# Patient Record
Sex: Female | Born: 1950 | Hispanic: No | Marital: Single | Smoking: Former smoker
Health system: Southern US, Community
[De-identification: ages and names within clinical notes are randomized; demographics above are authoritative.]

## PROBLEM LIST (undated history)

## (undated) DIAGNOSIS — E049 Nontoxic goiter, unspecified: Secondary | ICD-10-CM

## (undated) DIAGNOSIS — E785 Hyperlipidemia, unspecified: Secondary | ICD-10-CM

## (undated) DIAGNOSIS — Z860100 Personal history of colon polyps, unspecified: Secondary | ICD-10-CM

## (undated) DIAGNOSIS — K5900 Constipation, unspecified: Secondary | ICD-10-CM

## (undated) DIAGNOSIS — K649 Unspecified hemorrhoids: Secondary | ICD-10-CM

## (undated) DIAGNOSIS — R42 Dizziness and giddiness: Secondary | ICD-10-CM

## (undated) DIAGNOSIS — T7840XA Allergy, unspecified, initial encounter: Secondary | ICD-10-CM

## (undated) HISTORY — DX: Dizziness and giddiness: R42

## (undated) HISTORY — DX: Nontoxic goiter, unspecified: E04.9

## (undated) HISTORY — DX: Personal history of colonic polyps: Z86.010

## (undated) HISTORY — DX: Unspecified hemorrhoids: K64.9

## (undated) HISTORY — PX: TUBAL LIGATION: SHX77

## (undated) HISTORY — DX: Allergy, unspecified, initial encounter: T78.40XA

## (undated) HISTORY — DX: Hyperlipidemia, unspecified: E78.5

## (undated) HISTORY — DX: Personal history of colon polyps, unspecified: Z86.0100

## (undated) HISTORY — PX: TONSILLECTOMY: SUR1361

## (undated) HISTORY — DX: Constipation, unspecified: K59.00

---

## 2008-08-12 ENCOUNTER — Emergency Department (HOSPITAL_COMMUNITY): Admission: EM | Admit: 2008-08-12 | Discharge: 2008-08-12 | Payer: Self-pay | Admitting: Emergency Medicine

## 2008-12-16 ENCOUNTER — Emergency Department (HOSPITAL_COMMUNITY): Admission: EM | Admit: 2008-12-16 | Discharge: 2008-12-16 | Payer: Self-pay | Admitting: Emergency Medicine

## 2009-01-21 ENCOUNTER — Emergency Department (HOSPITAL_COMMUNITY): Admission: EM | Admit: 2009-01-21 | Discharge: 2009-01-21 | Payer: Self-pay | Admitting: Emergency Medicine

## 2010-07-22 LAB — URINALYSIS, ROUTINE W REFLEX MICROSCOPIC
Glucose, UA: NEGATIVE mg/dL
pH: 6 (ref 5.0–8.0)

## 2010-07-22 LAB — POCT I-STAT, CHEM 8
Calcium, Ion: 1.18 mmol/L (ref 1.12–1.32)
Glucose, Bld: 75 mg/dL (ref 70–99)
HCT: 47 % — ABNORMAL HIGH (ref 36.0–46.0)
Hemoglobin: 16 g/dL — ABNORMAL HIGH (ref 12.0–15.0)
Potassium: 4 mEq/L (ref 3.5–5.1)
TCO2: 26 mmol/L (ref 0–100)

## 2010-07-22 LAB — URINE MICROSCOPIC-ADD ON

## 2013-03-17 DEATH — deceased

## 2015-10-26 ENCOUNTER — Encounter: Payer: Self-pay | Admitting: Gastroenterology

## 2015-12-14 ENCOUNTER — Telehealth: Payer: Self-pay | Admitting: Gastroenterology

## 2015-12-14 ENCOUNTER — Ambulatory Visit (AMBULATORY_SURGERY_CENTER): Payer: Self-pay

## 2015-12-14 VITALS — Ht 64.0 in | Wt 154.0 lb

## 2015-12-14 DIAGNOSIS — Z86010 Personal history of colonic polyps: Secondary | ICD-10-CM

## 2015-12-14 MED ORDER — NA SULFATE-K SULFATE-MG SULF 17.5-3.13-1.6 GM/177ML PO SOLN: ORAL | 0 refills | Status: DC

## 2015-12-14 NOTE — Telephone Encounter (Signed)
Called pt and left message that Suprep bowel prep was sent to [facility] on [address].Call our office if she has any further questions

## 2015-12-14 NOTE — Progress Notes (Signed)
Per pt, no allergies to soy or egg products.Pt not taking any weight loss meds or using  O2 at home.   Pt states she had a colonoscopy done in [place]  5 years ago due to history of polyps.Will try to get records.

## 2015-12-29 ENCOUNTER — Encounter: Payer: Self-pay | Admitting: Gastroenterology

## 2016-09-01 ENCOUNTER — Other Ambulatory Visit: Payer: Self-pay | Admitting: Otolaryngology

## 2016-09-01 DIAGNOSIS — H8110 Benign paroxysmal vertigo, unspecified ear: Secondary | ICD-10-CM

## 2016-09-04 ENCOUNTER — Ambulatory Visit
Admission: RE | Admit: 2016-09-04 | Discharge: 2016-09-04 | Disposition: A | Discharge status: Home / Self Care | Payer: Medicare (Managed Care) | Source: Ambulatory Visit | Attending: Otolaryngology | Admitting: Otolaryngology

## 2016-09-04 DIAGNOSIS — H8110 Benign paroxysmal vertigo, unspecified ear: Secondary | ICD-10-CM

## 2016-09-04 MED ORDER — GADOBENATE DIMEGLUMINE 529 MG/ML IV SOLN
14.0000 mL | Freq: Once | INTRAVENOUS | Status: AC | PRN
  Administered 2016-09-04: 14 mL via INTRAVENOUS

## 2016-09-12 ENCOUNTER — Encounter: Payer: Self-pay | Admitting: Neurology

## 2016-09-12 ENCOUNTER — Ambulatory Visit (INDEPENDENT_AMBULATORY_CARE_PROVIDER_SITE_OTHER): Payer: Medicare (Managed Care) | Admitting: Neurology

## 2016-09-12 DIAGNOSIS — R42 Dizziness and giddiness: Secondary | ICD-10-CM | POA: Diagnosis not present

## 2016-09-12 MED ORDER — TOPIRAMATE 25 MG PO TABS: ORAL_TABLET | ORAL | 3 refills | Status: AC

## 2016-09-12 NOTE — Patient Instructions (Signed)
   We will give a trial on Topamax to see if this helps the dizziness.  Topamax (topiramate) is a seizure medication that has an FDA approval for seizures and for migraine headache. Potential side effects of this medication include weight loss, cognitive slowing, tingling in the fingers and toes, and carbonated drinks will taste bad. If any significant side effects are noted on this drug, please contact our office.

## 2016-09-12 NOTE — Progress Notes (Signed)
Reason for visit: Dizziness  Referring physician: Dr. Blase Mess [name] is a 66 y.o. female  History of present illness:  Hailey [name] is a 66 year old right-handed white female with a history of dizziness that dates back 5 years. The patient indicates that the symptoms have become more severe over time. The patient indicates that the symptoms began 5 years ago abruptly, they came on in the morning and was associated with true vertigo, she had nausea associated with this. The patient has had ongoing episodes, she has dizziness on a daily basis associated with head movement. The patient may have episodes where the symptoms become much more severe lasting 2 or 3 days and she is not able to function during these episodes. The patient reports some sensation of water in the right ear, no definite loss of hearing, she may have a humming noise in the right ear. The patient does not report any loss of hearing. She claims that she feels dizzy all the time to some degree. She denies any significant problems with headaches, she does have episodes of sparkling light and zig-zag lines in the vision that may come and go, the last episode occurred one month ago. The patient reports some chronic issues within the last several months with numbness involving the left face, arm, and leg. She denies any issues with falling, she does have constipation problems, she denies any issues with controlling the bladder. She was seen by Dr. Polly Cobia and a MRI of the brain was done. This shows fairly extensive involvement with white matter changes in the pons area. Otherwise, no significant small vessel disease is noted. The patient does not have a history of hypertension. She is sent to this office for an evaluation.   Past Medical History:  Diagnosis Date  . Allergy    seasonal  . Constipated    hx of/Miralax helps prn  . Goiter   . Hemorrhoids    history of   . History of colon polyps   . Vertigo    on and off     Past Surgical History:  Procedure Laterality Date  . TONSILLECTOMY     1960  . TUBAL LIGATION     1981    Family History  Problem Relation Age of Onset  . Diabetes Mother   . Hypertension Mother   . Heart disease Mother   . Gout Mother   . Alzheimer's disease Father   . Ulcers Brother     Social history:  reports that she quit smoking about 9 months ago. She smoked 1.00 pack per day. She has never used smokeless tobacco. She reports that she does not drink alcohol or use drugs.  Medications:  Prior to Admission medications   Medication Sig Start Date End Date Taking? Authorizing Provider  Cholecalciferol (VITAMIN D-3) 1000 units CAPS Take by mouth daily.   Yes [provider]  meclizine (ANTIVERT) 12.5 MG tablet Take 12.5 mg by mouth as needed for dizziness.   Yes [provider]  Multiple Vitamin (MULTIVITAMIN) tablet Take 1 tablet by mouth daily.   Yes [provider]  polyethylene glycol (MIRALAX / GLYCOLAX) packet Take 17 g by mouth daily as needed.   Yes [provider]  VIT C-CHOLECALCIFEROL-ROSE HIP PO Take 1 Dose by mouth daily.   Yes [provider]  vitamin B-12 (CYANOCOBALAMIN) 1000 MCG tablet Take 1,000 mcg by mouth daily.   Yes [provider]     No Known Allergies  ROS:  Out of a complete 14 system review of symptoms, the patient complains only of the following symptoms, and all other reviewed systems are negative.  Chills Palpitations of the heart Ringing in the ears Itching Feeling hot Achy muscles Headache, numbness, dizziness Not enough sleep Insomnia, restless legs  Blood pressure 102/64, pulse 60, height 5\' 4"  (1.626 m), weight 157 lb 8 oz (71.4 kg).  Physical Exam  General: The patient is alert and cooperative at the time of the examination.  Eyes: Pupils are equal, round, and reactive to light. Discs are flat bilaterally.  Neck: The neck is supple, no carotid bruits are  noted.  Respiratory: The respiratory examination is clear.  Cardiovascular: The cardiovascular examination reveals a regular rate and rhythm, no obvious murmurs or rubs are noted.  Neuromuscular: Range of movement of the cervical spine is full.  Skin: Extremities are without significant edema.  Neurologic Exam  Mental status: The patient is alert and oriented x 3 at the time of the examination. The patient has apparent normal recent and remote memory, with an apparently normal attention span and concentration ability.  Cranial nerves: Facial symmetry is present. There is good sensation of the face to pinprick and soft touch on the right, decreased on the left face. The strength of the facial muscles and the muscles to head turning and shoulder shrug are normal bilaterally. Speech is well enunciated, no aphasia or dysarthria is noted. Extraocular movements are full. Visual fields are full. The tongue is midline, and the patient has symmetric elevation of the soft palate. No obvious hearing deficits are noted.  Motor: The motor testing reveals 5 over 5 strength of all 4 extremities. Good symmetric motor tone is noted throughout.  Sensory: Sensory testing is intact to pinprick, soft touch, vibration sensation, and position sense on all 4 extremities, with exception of some decreased in pinprick sensation on the left arm and left leg. No evidence of extinction is noted.  Coordination: Cerebellar testing reveals good finger-nose-finger and heel-to-shin bilaterally.  Gait and station: Gait is normal. Tandem gait is unsteady. Romberg is negative. No drift is seen. The Nyan-Barrany procedure does elicit some subjective sensation of dizziness, but no nystagmus is seen.  Reflexes: Deep tendon reflexes are symmetric and normal bilaterally. Toes are downgoing bilaterally.   MRI brain 09/04/16:  IMPRESSION: Prominent pontine T2 and FLAIR hyperintensities favored to represent chronic microvascular  ischemic change. These changes are less prominent in the supratentorial region.  No retrocochlear lesion is identified. No temporal bone inflammatory process is evident.  * MRI scan images were reviewed online. I agree with the written report.    Assessment/Plan:  1. Chronic dizziness, vertigo  2. Abnormal MRI of the brain  3. Migraine or migraine equivalent  The patient appears to have what looks like ischemic changes in the pons area that could lead to chronic dizziness. The patient does not have a significant history of hypertension, however. She will be going on low-dose aspirin. The patient does have a history of migraine or migraine equivalent, I am not clear that this is the etiology of her dizziness. We will give her a trial on low-dose Topamax, if this is not effective, we may consider the use of diazepam in the future. If the changes of the pons seen on MRI are the source of the dizziness, this may be a difficult issue to treat. The patient will follow-up in 4 months.  Hailey Alexanders MD 09/12/2016 9:44 AM  [facility]  [address] [place], [place] [address]  Phone [phone] Fax [phone]

## 2017-02-15 ENCOUNTER — Emergency Department (HOSPITAL_BASED_OUTPATIENT_CLINIC_OR_DEPARTMENT_OTHER)
Admission: EM | Admit: 2017-02-15 | Discharge: 2017-02-15 | Disposition: A | Discharge status: Home / Self Care | Payer: Medicare (Managed Care) | Attending: Emergency Medicine | Admitting: Emergency Medicine

## 2017-02-15 ENCOUNTER — Emergency Department (HOSPITAL_BASED_OUTPATIENT_CLINIC_OR_DEPARTMENT_OTHER): Payer: Medicare (Managed Care)

## 2017-02-15 ENCOUNTER — Encounter (HOSPITAL_BASED_OUTPATIENT_CLINIC_OR_DEPARTMENT_OTHER): Payer: Self-pay | Admitting: *Deleted

## 2017-02-15 DIAGNOSIS — R110 Nausea: Secondary | ICD-10-CM | POA: Insufficient documentation

## 2017-02-15 DIAGNOSIS — Z87891 Personal history of nicotine dependence: Secondary | ICD-10-CM | POA: Insufficient documentation

## 2017-02-15 DIAGNOSIS — R51 Headache: Secondary | ICD-10-CM | POA: Diagnosis not present

## 2017-02-15 DIAGNOSIS — R200 Anesthesia of skin: Secondary | ICD-10-CM | POA: Insufficient documentation

## 2017-02-15 DIAGNOSIS — R519 Headache, unspecified: Secondary | ICD-10-CM

## 2017-02-15 LAB — BASIC METABOLIC PANEL
Anion gap: 4 — ABNORMAL LOW (ref 5–15)
BUN: 14 mg/dL (ref 6–20)
CALCIUM: 9.5 mg/dL (ref 8.9–10.3)
CHLORIDE: 103 mmol/L (ref 101–111)
CO2: 29 mmol/L (ref 22–32)
CREATININE: 0.77 mg/dL (ref 0.44–1.00)
GFR calc Af Amer: >60 mL/min (ref >60)
GFR calc non Af Amer: >60 mL/min (ref >60)
GLUCOSE: 86 mg/dL (ref 65–99)
Potassium: 4.6 mmol/L (ref 3.5–5.1)
Sodium: 136 mmol/L (ref 135–145)

## 2017-02-15 LAB — CBC WITH DIFFERENTIAL/PLATELET
BASOS PCT: 1 %
Basophils Absolute: 0 10*3/uL (ref 0.0–0.1)
Eosinophils Absolute: 0.5 10*3/uL (ref 0.0–0.7)
Eosinophils Relative: 11 %
HEMATOCRIT: 43.7 % (ref 36.0–46.0)
Hemoglobin: 14.6 g/dL (ref 12.0–15.0)
LYMPHS ABS: 1.7 10*3/uL (ref 0.7–4.0)
Lymphocytes Relative: 39 %
MCH: 28.6 pg (ref 26.0–34.0)
MCHC: 33.4 g/dL (ref 30.0–36.0)
MCV: 85.7 fL (ref 78.0–100.0)
MONO ABS: 0.4 10*3/uL (ref 0.1–1.0)
MONOS PCT: 10 %
NEUTROS ABS: 1.7 10*3/uL (ref 1.7–7.7)
Neutrophils Relative %: 39 %
Platelets: 271 10*3/uL (ref 150–400)
RBC: 5.1 MIL/uL (ref 3.87–5.11)
RDW: 13.1 % (ref 11.5–15.5)
WBC: 4.3 10*3/uL (ref 4.0–10.5)

## 2017-02-15 MED ORDER — SODIUM CHLORIDE 0.9 % IV BOLUS (SEPSIS)
500.0000 mL | Freq: Once | INTRAVENOUS | Status: AC
  Administered 2017-02-15: 500 mL via INTRAVENOUS

## 2017-02-15 MED ORDER — METOCLOPRAMIDE HCL 5 MG/ML IJ SOLN
10.0000 mg | Freq: Once | INTRAMUSCULAR | Status: AC
  Administered 2017-02-15: 10 mg via INTRAVENOUS
  Filled 2017-02-15: qty 2

## 2017-02-15 MED ORDER — KETOROLAC TROMETHAMINE 30 MG/ML IJ SOLN
30.0000 mg | Freq: Once | INTRAMUSCULAR | Status: AC
  Administered 2017-02-15: 30 mg via INTRAVENOUS
  Filled 2017-02-15: qty 1

## 2017-02-15 NOTE — ED Provider Notes (Signed)
Emergency Department Provider Note   I have reviewed the triage vital signs and the nursing notes.   HISTORY  Chief Complaint Headache and Nausea   HPI Hailey [name] is a 66 y.o. female resents to the emergency department for evaluation of past 3 days.  She is also had intermittent numb feeling in the left leg and her tips.  She states she has had headaches before but this 1 feels very different.  She has nausea but no vomiting.  No head injury.  No fevers or chills.  No history of similar headaches in the past.  The patient's left leg numbness is described as during the night.  The patient states she will wake up and have pain and numbness in the leg.  She will reposition which point she has a pins and needle sensation and the numbness resolves.  She denies any current numbness or tingling in the arms or legs.  No speech changes.  No vision changes. No new medications.    Past Medical History:  Diagnosis Date  . Allergy    seasonal  . Constipated    hx of/Miralax helps prn  . Goiter   . Hemorrhoids    history of   . History of colon polyps   . Vertigo    on and off    Patient Active Problem List   Diagnosis Date Noted  . Vertigo 09/12/2016    Past Surgical History:  Procedure Laterality Date  . TONSILLECTOMY     1960  . TUBAL LIGATION     1981    Current Outpatient Rx  . Order #: [id number] Class: Historical Med  . Order #: [id number] Class: Historical Med  . Order #: [id number] Class: Historical Med  . Order #: [id number] Class: Historical Med  . Order #: [id number] Class: Normal  . Order #: [id number] Class: Historical Med  . Order #: [id number] Class: Historical Med    Allergies Patient has no known allergies.  Family History  Problem Relation Age of Onset  . Diabetes Mother   . Hypertension Mother   . Heart disease Mother   . Gout Mother   . Alzheimer's disease Father   . Ulcers Brother     Social History Social History  Substance Use Topics  . Smoking  status: Former Smoker    Packs/day: 1.00    Quit date: 11/27/2015  . Smokeless tobacco: Never Used  . Alcohol use No    Review of Systems  Constitutional: No fever/chills Eyes: No visual changes. ENT: No sore throat. Cardiovascular: Denies chest pain. Respiratory: Denies shortness of breath. Gastrointestinal: No abdominal pain. Positive nausea, no vomiting.  No diarrhea.  No constipation. Genitourinary: Negative for dysuria. Musculoskeletal: Negative for back pain. Skin: Negative for rash. Neurological: Negative for focal weakness or numbness. Positive HA.   10-point ROS otherwise negative.  ____________________________________________   PHYSICAL EXAM:  VITAL SIGNS: ED Triage Vitals  Enc Vitals Group     BP 02/15/17 1253 118/77     Pulse Rate 02/15/17 1253 67     Resp 02/15/17 1253 18     Temp 02/15/17 1253 98 F (36.7 C)     Temp Source 02/15/17 1253 Oral     SpO2 02/15/17 1253 98 %     Weight 02/15/17 1251 157 lb (71.2 kg)     Height 02/15/17 1251 5\' 4"  (1.626 m)     Pain Score 02/15/17 1250 8   Constitutional: Alert and oriented. Well appearing and in no acute distress. Eyes: Conjunctivae are normal.  PERRL. EOMI.  Head: Atraumatic. Nose: No congestion/rhinnorhea. Mouth/Throat: Mucous membranes are moist.  Neck: No stridor.  Cardiovascular: Normal rate, regular rhythm. Good peripheral circulation. Grossly normal heart sounds.   Respiratory: Normal respiratory effort.  No retractions. Lungs CTAB. Gastrointestinal: Soft and nontender. No distention.  Musculoskeletal: No lower extremity tenderness nor edema. No gross deformities of extremities. Neurologic:  Normal speech and language. No gross focal neurologic deficits are appreciated. Normal finger-to-nose testing, Normal CN exam 2-12. No pronator drift.  Skin:  Skin is warm, dry and intact. No rash noted.  ____________________________________________   LABS (all labs ordered are listed, but only abnormal  results are displayed)  Labs Reviewed  BASIC METABOLIC PANEL - Abnormal; Notable for the following:       Result Value   Anion gap 4 (*)    All other components within normal limits  CBC WITH DIFFERENTIAL/PLATELET   ____________________________________________  RADIOLOGY  Ct Head Wo Contrast  Result Date: 02/15/2017 CLINICAL DATA:  Headache, nausea EXAM: CT HEAD WITHOUT CONTRAST TECHNIQUE: Contiguous axial images were obtained from the base of the skull through the vertex without intravenous contrast. COMPARISON:  09/04/2016 MRI FINDINGS: Brain: No acute intracranial abnormality. Specifically, no hemorrhage, hydrocephalus, mass lesion, acute infarction, or significant intracranial injury. Vascular: No hyperdense vessel or unexpected calcification. Skull: No acute calvarial abnormality. Sinuses/Orbits: Visualized paranasal sinuses and mastoids clear. Orbital soft tissues unremarkable. Other: None IMPRESSION: No acute intracranial abnormality. Electronically Signed   By: Rolm Baptise M.D.   On: 02/15/2017 13:14    ____________________________________________   PROCEDURES  Procedure(s) performed:   Procedures  None ____________________________________________   INITIAL IMPRESSION / ASSESSMENT AND PLAN / ED COURSE  Pertinent labs & imaging results that were available during my care of the patient were reviewed by me and considered in my medical decision making (see chart for details).  Patient presents to the emergency department for evaluation of headache with nausea.  Her numbness appears to be only occurring at night and improved with change in position.  No residual numbness on my exam.  No weakness.  No sudden onset, maximal intensity headache symptoms.  Given that this is new headache that is been going on for 3 days, along with the patient's age, I do plan for screening head CT and baseline labs.  Will treat with Toradol and Reglan.   HA completely resolved with Toradol and  Reglan. CT negative. Labs negative. Plan for PCP follow up and supportive care at home. Will keep HA diary and f/u with Neurology if HA returns/persists. Discussed return precautions in detail.   At this time, I do not feel there is any life-threatening condition present. I have reviewed and discussed all results (EKG, imaging, lab, urine as appropriate), exam findings with patient. I have reviewed nursing notes and appropriate previous records.  I feel the patient is safe to be discharged home without further emergent workup. Discussed usual and customary return precautions. Patient and family (if present) verbalize understanding and are comfortable with this plan.  Patient will follow-up with their primary care provider. If they do not have a primary care provider, information for follow-up has been provided to them. All questions have been answered.  ____________________________________________  FINAL CLINICAL IMPRESSION(S) / ED DIAGNOSES  Final diagnoses:  Bad headache     MEDICATIONS GIVEN DURING THIS VISIT:  Medications  sodium chloride 0.9 % bolus 500 mL (0 mLs Intravenous Stopped 02/15/17 1417)  ketorolac (TORADOL) 30 MG/ML injection 30 mg (30 mg Intravenous Given 02/15/17  1324)  metoCLOPramide (REGLAN) injection 10 mg (10 mg Intravenous Given 02/15/17 1325)     NEW OUTPATIENT MEDICATIONS STARTED DURING THIS VISIT:  None   Note:  This document was prepared using Dragon voice recognition software and may include unintentional dictation errors.  Nanda Quinton, MD Emergency Medicine    Carey Johndrow, [facility], MD 02/15/17 [phone]

## 2017-02-15 NOTE — Discharge Instructions (Signed)

## 2017-02-15 NOTE — ED Triage Notes (Addendum)
Headache and nausea x 3 days. No hx of headaches. States the only other thing that has been different is a numb feeling in her left leg x 3 days. She is ambulatory.

## 2017-05-10 ENCOUNTER — Encounter: Payer: Self-pay | Admitting: Family Medicine

## 2018-02-15 ENCOUNTER — Emergency Department (HOSPITAL_COMMUNITY): Payer: Medicare (Managed Care)

## 2018-02-15 ENCOUNTER — Encounter (HOSPITAL_COMMUNITY): Payer: Self-pay | Admitting: Nurse Practitioner

## 2018-02-15 ENCOUNTER — Other Ambulatory Visit: Payer: Self-pay

## 2018-02-15 ENCOUNTER — Observation Stay (HOSPITAL_COMMUNITY)
Admission: EM | Admit: 2018-02-15 | Discharge: 2018-02-17 | Disposition: A | Discharge status: Home / Self Care | Payer: Medicare (Managed Care) | Attending: Surgery | Admitting: Surgery

## 2018-02-15 DIAGNOSIS — Z87891 Personal history of nicotine dependence: Secondary | ICD-10-CM | POA: Diagnosis not present

## 2018-02-15 DIAGNOSIS — M5137 Other intervertebral disc degeneration, lumbosacral region: Secondary | ICD-10-CM | POA: Insufficient documentation

## 2018-02-15 DIAGNOSIS — Z7982 Long term (current) use of aspirin: Secondary | ICD-10-CM | POA: Insufficient documentation

## 2018-02-15 DIAGNOSIS — I708 Atherosclerosis of other arteries: Secondary | ICD-10-CM | POA: Insufficient documentation

## 2018-02-15 DIAGNOSIS — Z79899 Other long term (current) drug therapy: Secondary | ICD-10-CM | POA: Insufficient documentation

## 2018-02-15 DIAGNOSIS — K3580 Unspecified acute appendicitis: Secondary | ICD-10-CM | POA: Diagnosis not present

## 2018-02-15 DIAGNOSIS — K37 Unspecified appendicitis: Secondary | ICD-10-CM | POA: Diagnosis present

## 2018-02-15 LAB — BASIC METABOLIC PANEL
Anion gap: 8 (ref 5–15)
BUN: 12 mg/dL (ref 8–23)
CO2: 28 mmol/L (ref 22–32)
Calcium: 9.1 mg/dL (ref 8.9–10.3)
Chloride: 104 mmol/L (ref 98–111)
Creatinine, Ser: 0.74 mg/dL (ref 0.44–1.00)
Glucose, Bld: 102 mg/dL — ABNORMAL HIGH (ref 70–99)
POTASSIUM: 3.6 mmol/L (ref 3.5–5.1)
SODIUM: 140 mmol/L (ref 135–145)

## 2018-02-15 LAB — URINALYSIS, ROUTINE W REFLEX MICROSCOPIC
BACTERIA UA: NONE SEEN
Bilirubin Urine: NEGATIVE
GLUCOSE, UA: NEGATIVE mg/dL
KETONES UR: NEGATIVE mg/dL
Nitrite: NEGATIVE
PROTEIN: NEGATIVE mg/dL
Specific Gravity, Urine: 1.01 (ref 1.005–1.030)
pH: 6 (ref 5.0–8.0)

## 2018-02-15 LAB — CBC
HCT: 43.1 % (ref 36.0–46.0)
Hemoglobin: 13.7 g/dL (ref 12.0–15.0)
MCH: 27.9 pg (ref 26.0–34.0)
MCHC: 31.8 g/dL (ref 30.0–36.0)
MCV: 87.8 fL (ref 80.0–100.0)
Platelets: 285 10*3/uL (ref 150–400)
RBC: 4.91 MIL/uL (ref 3.87–5.11)
RDW: 11.9 % (ref 11.5–15.5)
WBC: 5.4 10*3/uL (ref 4.0–10.5)
nRBC: 0 % (ref 0.0–0.2)

## 2018-02-15 LAB — HEPATIC FUNCTION PANEL
ALT: 11 U/L (ref 0–44)
AST: 16 U/L (ref 15–41)
Albumin: 3.9 g/dL (ref 3.5–5.0)
Alkaline Phosphatase: 51 U/L (ref 38–126)
Total Bilirubin: 0.4 mg/dL (ref 0.3–1.2)
Total Protein: 7.3 g/dL (ref 6.5–8.1)

## 2018-02-15 LAB — LIPASE, BLOOD: LIPASE: 24 U/L (ref 11–51)

## 2018-02-15 MED ORDER — MORPHINE SULFATE (PF) 4 MG/ML IV SOLN: 4.0000 mg | INTRAVENOUS | Status: DC | PRN

## 2018-02-15 MED ORDER — ONDANSETRON HCL 4 MG/2ML IJ SOLN
4.0000 mg | Freq: Once | INTRAMUSCULAR | Status: AC
  Administered 2018-02-15: 4 mg via INTRAVENOUS
  Filled 2018-02-15: qty 2

## 2018-02-15 MED ORDER — SODIUM CHLORIDE 0.9 % IV BOLUS
500.0000 mL | Freq: Once | INTRAVENOUS | Status: AC
  Administered 2018-02-15: 500 mL via INTRAVENOUS

## 2018-02-15 MED ORDER — KETOROLAC TROMETHAMINE 30 MG/ML IJ SOLN
30.0000 mg | Freq: Once | INTRAMUSCULAR | Status: AC
  Administered 2018-02-15: 30 mg via INTRAVENOUS
  Filled 2018-02-15: qty 1

## 2018-02-15 MED ORDER — PIPERACILLIN-TAZOBACTAM 3.375 G IVPB 30 MIN
3.3750 g | Freq: Once | INTRAVENOUS | Status: AC
  Administered 2018-02-15: 3.375 g via INTRAVENOUS
  Filled 2018-02-15: qty 50

## 2018-02-15 NOTE — ED Triage Notes (Signed)
Pt is c/o right sided flank sharp pain that radiates to the lower back/costvertibral angel and anteriorly to her suprapubic/bladder area. Denies fever or chills.

## 2018-02-15 NOTE — ED Provider Notes (Signed)
Emergency Department Provider Note   I have reviewed the triage vital signs and the nursing notes.   HISTORY  Chief Complaint Flank Pain   HPI Hailey [name] is a 67 y.o. female with PMH of vertigo and hemorrhoids presents to the ED with sharp RLQ abdominal pain and right flank pain. Pain symptoms began 2 days prior and have worsened over that time.  The patient denies any dysuria, hesitancy, urgency.  She is not experienced fevers or chills.  She has reported some episodic lightheadedness.  Denies any chest pain, palpitations, shortness of breath.  No similar pain in the past.  No history of kidney stones.  No prior abdominal surgeries. No diarrhea. Pain is sharp, severe, and radiates to the right flank.    Past Medical History:  Diagnosis Date  . Allergy    seasonal  . Constipated    hx of/Miralax helps prn  . Goiter   . Hemorrhoids    history of   . History of colon polyps   . Vertigo    on and off    Patient Active Problem List   Diagnosis Date Noted  . Appendicitis 02/15/2018  . Vertigo 09/12/2016    Past Surgical History:  Procedure Laterality Date  . TONSILLECTOMY     1960  . TUBAL LIGATION     1981    Allergies Patient has no known allergies.  Family History  Problem Relation Age of Onset  . Diabetes Mother   . Hypertension Mother   . Heart disease Mother   . Gout Mother   . Alzheimer's disease Father   . Ulcers Brother     Social History Social History   Tobacco Use  . Smoking status: Former Smoker    Packs/day: 1.00    Last attempt to quit: 11/27/2015    Years since quitting: 2.2  . Smokeless tobacco: Never Used  Substance Use Topics  . Alcohol use: No  . Drug use: No    Review of Systems  Constitutional: No fever/chills. Positive intermittent lightheadedness.  Eyes: No visual changes. ENT: No sore throat. Cardiovascular: Denies chest pain. Respiratory: Denies shortness of breath. Gastrointestinal: Positive RLQ abdominal/flank  pain. Positive nausea, no vomiting. No diarrhea. No constipation. Genitourinary: Negative for dysuria. Musculoskeletal: Negative for back pain. Skin: Negative for rash. Neurological: Negative for headaches, focal weakness or numbness.  10-point ROS otherwise negative.  ____________________________________________   PHYSICAL EXAM:  VITAL SIGNS: ED Triage Vitals  Enc Vitals Group     BP 02/15/18 2136 121/69     Pulse Rate 02/15/18 2136 64     Resp 02/15/18 2136 16     Temp 02/15/18 2136 98 F (36.7 C)     Temp Source 02/15/18 2136 Oral     SpO2 02/15/18 2136 100 %     Weight 02/15/18 2136 146 lb 9.6 oz (66.5 kg)     Pain Score 02/15/18 2147 7   Constitutional: Alert and oriented. Well appearing and in no acute distress. Eyes: Conjunctivae are normal.  Head: Atraumatic. Nose: No congestion/rhinnorhea. Mouth/Throat: Mucous membranes are moist.  Oropharynx non-erythematous. Neck: No stridor.  Cardiovascular: Normal rate, regular rhythm. Good peripheral circulation. Grossly normal heart sounds.   Respiratory: Normal respiratory effort.  No retractions. Lungs CTAB. Gastrointestinal: Soft with tenderness to palpation in the RLQ with mild right CVA tenderness. No distention.  Musculoskeletal: No lower extremity tenderness nor edema. No gross deformities of extremities. Neurologic:  Normal speech and language. No gross focal neurologic deficits are  appreciated.  Skin:  Skin is warm, dry and intact. No rash noted.  ____________________________________________   LABS (all labs ordered are listed, but only abnormal results are displayed)  Labs Reviewed  URINALYSIS, ROUTINE W REFLEX MICROSCOPIC - Abnormal; Notable for the following components:      Result Value   Hgb urine dipstick SMALL (*)    Leukocytes, UA MODERATE (*)    All other components within normal limits  BASIC METABOLIC PANEL - Abnormal; Notable for the following components:   Glucose, Bld 102 (*)    All other  components within normal limits  URINE CULTURE  CBC  LIPASE, BLOOD  HEPATIC FUNCTION PANEL   ____________________________________________  RADIOLOGY  Ct Renal Stone Study  Result Date: 02/15/2018 CLINICAL DATA:  Right flank pain radiating to the pelvis since this morning. EXAM: CT ABDOMEN AND PELVIS WITHOUT CONTRAST TECHNIQUE: Multidetector CT imaging of the abdomen and pelvis was performed following the standard protocol without IV contrast. COMPARISON:  None. FINDINGS: Lower chest: Trace pericardial effusion. Top-normal size heart. Dependent atelectasis at each lung base. Hepatobiliary: Small cystic foci are noted of the left hepatic lobe, the largest slightly lobular in appearance and measuring up to 10 mm. No biliary dilatation. Contracted gallbladder without stones. Pancreas: Normal Spleen: Normal Adrenals/Urinary Tract: Normal bilateral adrenal glands. No nephrolithiasis. 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 The urinary bladder is unremarkable for the degree of distention. Stomach/Bowel: Appendix: Location: Right hemipelvis just lateral and anterior to the right psoas, series 2/55. Diameter: 9 mm Appendicolith: Near the base measuring up to 6 mm. Mucosal hyper-enhancement: Not applicable due to lack of IV contrast. Extraluminal gas: None Periappendiceal collection: No collection but periappendiceal inflammation is seen. No bowel obstruction. The stomach is unremarkable. Normal small bowel rotation is demonstrated. Average amount of fecal retention within the colon. Vascular/Lymphatic: Mild aortoiliac atherosclerosis.  No adenopathy. Reproductive: Uterus and bilateral adnexa are unremarkable. Other: No free air or free fluid. Musculoskeletal: Degenerative disc disease L5-S1. No acute nor aggressive osseous abnormalities. IMPRESSION: Acute uncomplicated appendicitis. 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 Hepatic hypodensities compatible  small cysts, largest approximately 1 cm. Electronically Signed   By: Ashley Royalty M.D.   On: 02/15/2018 22:55     EKG:    EKG Interpretation  Date/Time:  Friday February 15 2018 22:25:29 EDT Ventricular Rate:  57 PR Interval:    QRS Duration: 83 QT Interval:  419 QTC Calculation: 408 R Axis:   38 Text Interpretation:  Sinus rhythm No STEMI.  Confirmed by Nanda Quinton [id number]) on 02/15/2018 10:30:06 PM       ____________________________________________   PROCEDURES  Procedure(s) performed:   Procedures  None ____________________________________________   INITIAL IMPRESSION / ASSESSMENT AND PLAN / ED COURSE  Pertinent labs & imaging results that were available during my care of the patient were reviewed by me and considered in my medical decision making (see chart for details).  Patient presents to the emergency department for evaluation of right lower abdominal pain/right flank pain with nausea and some occasional lightheadedness.  EKG reviewed with no acute findings.  She does have tenderness on exam but clinically seems most consistent with a kidney stone.  She does have a small amount of blood on UA without evidence of infection.  11:21 PM CT reviewed with evidence of acute appendicitis. Patient last PO was 5:30 PM today. No leukocytosis. Will page surgery to discuss. Starting Zosyn.   Discussed patient's case with Surgery, Dr. Georgette Dover to request  admission. Patient and family (if present) updated with plan. Care transferred to Surgery service. I have placed holding orders at his request.   I reviewed all nursing notes, vitals, pertinent old records, EKGs, labs, imaging (as available).  ____________________________________________  FINAL CLINICAL IMPRESSION(S) / ED DIAGNOSES  Final diagnoses:  Acute appendicitis, unspecified acute appendicitis type     MEDICATIONS GIVEN DURING THIS VISIT:  Medications  sodium chloride 0.9 % bolus 500 mL (500 mLs Intravenous New  Bag/Given 02/15/18 2221)  piperacillin-tazobactam (ZOSYN) IVPB 3.375 g (has no administration in time range)  morphine 4 MG/ML injection 4 mg (has no administration in time range)  ketorolac (TORADOL) 30 MG/ML injection 30 mg (30 mg Intravenous Given 02/15/18 2223)  ondansetron (ZOFRAN) injection 4 mg (4 mg Intravenous Given 02/15/18 2222)    Note:  This document was prepared using Dragon voice recognition software and may include unintentional dictation errors.  Nanda Quinton, MD Emergency Medicine    Shakya Sebring, [facility], MD 02/15/18 [phone]

## 2018-02-16 ENCOUNTER — Encounter (HOSPITAL_COMMUNITY): Payer: Self-pay | Admitting: Registered Nurse

## 2018-02-16 ENCOUNTER — Observation Stay (HOSPITAL_COMMUNITY): Payer: Medicare (Managed Care) | Admitting: Anesthesiology

## 2018-02-16 ENCOUNTER — Other Ambulatory Visit: Payer: Self-pay

## 2018-02-16 ENCOUNTER — Encounter (HOSPITAL_COMMUNITY)
Admission: EM | Disposition: A | Discharge status: Home / Self Care | Payer: Self-pay | Source: Home / Self Care | Attending: Emergency Medicine

## 2018-02-16 DIAGNOSIS — I708 Atherosclerosis of other arteries: Secondary | ICD-10-CM | POA: Diagnosis not present

## 2018-02-16 DIAGNOSIS — K3580 Unspecified acute appendicitis: Secondary | ICD-10-CM | POA: Diagnosis not present

## 2018-02-16 DIAGNOSIS — Z7982 Long term (current) use of aspirin: Secondary | ICD-10-CM | POA: Diagnosis not present

## 2018-02-16 DIAGNOSIS — M5137 Other intervertebral disc degeneration, lumbosacral region: Secondary | ICD-10-CM | POA: Diagnosis not present

## 2018-02-16 HISTORY — PX: LAPAROSCOPIC APPENDECTOMY: SHX408

## 2018-02-16 LAB — SURGICAL PCR SCREEN
MRSA, PCR: NEGATIVE
STAPHYLOCOCCUS AUREUS: NEGATIVE

## 2018-02-16 SURGERY — APPENDECTOMY, LAPAROSCOPIC
Anesthesia: General | Site: Abdomen

## 2018-02-16 MED ORDER — OXYCODONE HCL 5 MG PO TABS: 5.0000 mg | ORAL_TABLET | Freq: Once | ORAL | Status: DC | PRN

## 2018-02-16 MED ORDER — 0.9 % SODIUM CHLORIDE (POUR BTL) OPTIME
TOPICAL | Status: DC | PRN
  Administered 2018-02-16: 1000 mL

## 2018-02-16 MED ORDER — ONDANSETRON HCL 4 MG/2ML IJ SOLN
INTRAMUSCULAR | Status: DC | PRN
  Administered 2018-02-16: 4 mg via INTRAVENOUS

## 2018-02-16 MED ORDER — OXYCODONE HCL 5 MG PO TABS
5.0000 mg | ORAL_TABLET | ORAL | Status: DC | PRN
  Administered 2018-02-16: 5 mg via ORAL
  Filled 2018-02-16: qty 1

## 2018-02-16 MED ORDER — FENTANYL CITRATE (PF) 250 MCG/5ML IJ SOLN
INTRAMUSCULAR | Status: AC
  Filled 2018-02-16: qty 5

## 2018-02-16 MED ORDER — ROCURONIUM BROMIDE 10 MG/ML (PF) SYRINGE
PREFILLED_SYRINGE | INTRAVENOUS | Status: DC | PRN
  Administered 2018-02-16: 25 mg via INTRAVENOUS
  Administered 2018-02-16: 5 mg via INTRAVENOUS

## 2018-02-16 MED ORDER — BUPIVACAINE HCL (PF) 0.5 % IJ SOLN
INTRAMUSCULAR | Status: DC | PRN
  Administered 2018-02-16: 20 mL

## 2018-02-16 MED ORDER — EPHEDRINE SULFATE 50 MG/ML IJ SOLN
INTRAMUSCULAR | Status: DC | PRN
  Administered 2018-02-16: 10 mg via INTRAVENOUS

## 2018-02-16 MED ORDER — MIDAZOLAM HCL 2 MG/2ML IJ SOLN
INTRAMUSCULAR | Status: AC
  Filled 2018-02-16: qty 2

## 2018-02-16 MED ORDER — CEFAZOLIN SODIUM-DEXTROSE 2-3 GM-%(50ML) IV SOLR
INTRAVENOUS | Status: DC | PRN
  Administered 2018-02-16: 2 g via INTRAVENOUS

## 2018-02-16 MED ORDER — DEXAMETHASONE SODIUM PHOSPHATE 10 MG/ML IJ SOLN
INTRAMUSCULAR | Status: DC | PRN
  Administered 2018-02-16: 10 mg via INTRAVENOUS

## 2018-02-16 MED ORDER — PROMETHAZINE HCL 25 MG/ML IJ SOLN: 6.2500 mg | INTRAMUSCULAR | Status: DC | PRN

## 2018-02-16 MED ORDER — FENTANYL CITRATE (PF) 100 MCG/2ML IJ SOLN: 25.0000 ug | INTRAMUSCULAR | Status: DC | PRN

## 2018-02-16 MED ORDER — FENTANYL CITRATE (PF) 100 MCG/2ML IJ SOLN
INTRAMUSCULAR | Status: DC | PRN
  Administered 2018-02-16: 100 ug via INTRAVENOUS

## 2018-02-16 MED ORDER — PHENYLEPHRINE HCL 10 MG/ML IJ SOLN
INTRAMUSCULAR | Status: DC | PRN
  Administered 2018-02-16: 120 ug via INTRAVENOUS

## 2018-02-16 MED ORDER — KETOROLAC TROMETHAMINE 30 MG/ML IJ SOLN: 30.0000 mg | Freq: Once | INTRAMUSCULAR | Status: DC | PRN

## 2018-02-16 MED ORDER — PROPOFOL 10 MG/ML IV BOLUS
INTRAVENOUS | Status: AC
  Filled 2018-02-16: qty 20

## 2018-02-16 MED ORDER — ONDANSETRON HCL 4 MG/2ML IJ SOLN
INTRAMUSCULAR | Status: AC
  Filled 2018-02-16: qty 2

## 2018-02-16 MED ORDER — EPHEDRINE 5 MG/ML INJ
INTRAVENOUS | Status: AC
  Filled 2018-02-16: qty 10

## 2018-02-16 MED ORDER — SUGAMMADEX SODIUM 200 MG/2ML IV SOLN
INTRAVENOUS | Status: DC | PRN
  Administered 2018-02-16: 150 mg via INTRAVENOUS

## 2018-02-16 MED ORDER — SUCCINYLCHOLINE CHLORIDE 200 MG/10ML IV SOSY
PREFILLED_SYRINGE | INTRAVENOUS | Status: AC
  Filled 2018-02-16: qty 10

## 2018-02-16 MED ORDER — LACTATED RINGERS IV SOLN
INTRAVENOUS | Status: DC
  Administered 2018-02-16: 08:00:00 via INTRAVENOUS

## 2018-02-16 MED ORDER — BUPIVACAINE HCL (PF) 0.5 % IJ SOLN
INTRAMUSCULAR | Status: AC
  Filled 2018-02-16: qty 30

## 2018-02-16 MED ORDER — MIDAZOLAM HCL 5 MG/5ML IJ SOLN
INTRAMUSCULAR | Status: DC | PRN
  Administered 2018-02-16: 1 mg via INTRAVENOUS

## 2018-02-16 MED ORDER — PROPOFOL 10 MG/ML IV BOLUS
INTRAVENOUS | Status: DC | PRN
  Administered 2018-02-16: 140 mg via INTRAVENOUS

## 2018-02-16 MED ORDER — PHENYLEPHRINE 40 MCG/ML (10ML) SYRINGE FOR IV PUSH (FOR BLOOD PRESSURE SUPPORT)
PREFILLED_SYRINGE | INTRAVENOUS | Status: AC
  Filled 2018-02-16: qty 10

## 2018-02-16 MED ORDER — DEXAMETHASONE SODIUM PHOSPHATE 10 MG/ML IJ SOLN
INTRAMUSCULAR | Status: AC
  Filled 2018-02-16: qty 1

## 2018-02-16 MED ORDER — SUCCINYLCHOLINE CHLORIDE 200 MG/10ML IV SOSY
PREFILLED_SYRINGE | INTRAVENOUS | Status: DC | PRN
  Administered 2018-02-16: 120 mg via INTRAVENOUS

## 2018-02-16 MED ORDER — ONDANSETRON HCL 4 MG/2ML IJ SOLN
4.0000 mg | Freq: Four times a day (QID) | INTRAMUSCULAR | Status: DC | PRN
  Administered 2018-02-16 (×2): 4 mg via INTRAVENOUS
  Filled 2018-02-16 (×2): qty 2

## 2018-02-16 MED ORDER — LACTATED RINGERS IV SOLN
INTRAVENOUS | Status: DC | PRN
  Administered 2018-02-16: 08:00:00 via INTRAVENOUS

## 2018-02-16 MED ORDER — MORPHINE SULFATE (PF) 2 MG/ML IV SOLN: 1.0000 mg | INTRAVENOUS | Status: DC | PRN

## 2018-02-16 MED ORDER — LIDOCAINE 2% (20 MG/ML) 5 ML SYRINGE
INTRAMUSCULAR | Status: DC | PRN
  Administered 2018-02-16: 25 mg via INTRAVENOUS
  Administered 2018-02-16: 75 mg via INTRAVENOUS

## 2018-02-16 MED ORDER — LACTATED RINGERS IV SOLN
INTRAVENOUS | Status: AC | PRN
  Administered 2018-02-16: 1000 mL

## 2018-02-16 MED ORDER — ROCURONIUM BROMIDE 100 MG/10ML IV SOLN
INTRAVENOUS | Status: AC
  Filled 2018-02-16: qty 1

## 2018-02-16 MED ORDER — CEFAZOLIN SODIUM-DEXTROSE 2-4 GM/100ML-% IV SOLN
INTRAVENOUS | Status: AC
  Filled 2018-02-16: qty 100

## 2018-02-16 MED ORDER — OXYCODONE HCL 5 MG/5ML PO SOLN
5.0000 mg | Freq: Once | ORAL | Status: DC | PRN
  Filled 2018-02-16: qty 5

## 2018-02-16 SURGICAL SUPPLY — 30 items
APPLIER CLIP 5 13 M/L LIGAMAX5 (MISCELLANEOUS)
CHLORAPREP W/TINT 26ML (MISCELLANEOUS) ×3 IMPLANT
CLIP APPLIE 5 13 M/L LIGAMAX5 (MISCELLANEOUS) IMPLANT
COVER SURGICAL LIGHT HANDLE (MISCELLANEOUS) ×3 IMPLANT
COVER WAND RF STERILE (DRAPES) IMPLANT
CUTTER FLEX LINEAR 45M (STAPLE) ×3 IMPLANT
DECANTER SPIKE VIAL GLASS SM (MISCELLANEOUS) ×3 IMPLANT
DERMABOND ADVANCED (GAUZE/BANDAGES/DRESSINGS) ×2
DERMABOND ADVANCED .7 DNX12 (GAUZE/BANDAGES/DRESSINGS) ×1 IMPLANT
DRAPE LAPAROSCOPIC ABDOMINAL (DRAPES) ×3 IMPLANT
ELECT COAG MONOPOLAR (ELECTROSURGICAL) ×3
ELECT REM PT RETURN 15FT ADLT (MISCELLANEOUS) ×3 IMPLANT
ELECTRODE COAG MONOPOLAR (ELECTROSURGICAL) ×1 IMPLANT
GLOVE SURG SIGNA 7.5 PF LTX (GLOVE) ×6 IMPLANT
GOWN STRL REUS W/TWL XL LVL3 (GOWN DISPOSABLE) ×6 IMPLANT
KIT BASIN OR (CUSTOM PROCEDURE TRAY) ×3 IMPLANT
POUCH SPECIMEN RETRIEVAL 10MM (ENDOMECHANICALS) ×3 IMPLANT
RELOAD 45 VASCULAR/THIN (ENDOMECHANICALS) IMPLANT
RELOAD STAPLE TA45 3.5 REG BLU (ENDOMECHANICALS) ×3 IMPLANT
SET IRRIG TUBING LAPAROSCOPIC (IRRIGATION / IRRIGATOR) ×3 IMPLANT
SHEARS HARMONIC ACE PLUS 36CM (ENDOMECHANICALS) ×3 IMPLANT
SLEEVE XCEL OPT CAN 5 100 (ENDOMECHANICALS) ×3 IMPLANT
SUT MNCRL AB 4-0 PS2 18 (SUTURE) ×3 IMPLANT
TOWEL OR 17X26 10 PK STRL BLUE (TOWEL DISPOSABLE) ×3 IMPLANT
TOWEL OR NON WOVEN STRL DISP B (DISPOSABLE) ×3 IMPLANT
TRAY FOLEY MTR SLVR 16FR STAT (SET/KITS/TRAYS/PACK) ×3 IMPLANT
TRAY LAPAROSCOPIC (CUSTOM PROCEDURE TRAY) ×3 IMPLANT
TROCAR BLADELESS OPT 5 100 (ENDOMECHANICALS) ×3 IMPLANT
TROCAR XCEL BLUNT TIP 100MML (ENDOMECHANICALS) ×3 IMPLANT
TUBING INSUF HEATED (TUBING) ×3 IMPLANT

## 2018-02-16 NOTE — Op Note (Signed)
Appendectomy, Lap, Procedure Note  Indications: The patient presented with a history of right-sided abdominal pain. A CT revealed findings consistent with acute appendicitis.  Pre-operative Diagnosis: acute appendicitis  Post-operative Diagnosis: Same  Surgeon: Coralie Keens A   Assistants: 0  Anesthesia: General endotracheal anesthesia  ASA Class: 2  Procedure Details  The patient was seen again in the Holding Room. The risks, benefits, complications, treatment options, and expected outcomes were discussed with the patient and/or family. The possibilities of reaction to medication, perforation of viscus, bleeding, recurrent infection, finding a normal appendix, the need for additional procedures, failure to diagnose a condition, and creating a complication requiring transfusion or operation were discussed. There was concurrence with the proposed plan and informed consent was obtained. The site of surgery was properly noted. The patient was taken to Operating Room, identified as Hailey [name] and the procedure verified as Appendectomy. A Time Out was held and the above information confirmed.  The patient was placed in the supine position and general anesthesia was induced, along with placement of orogastric tube, Venodyne boots, and a Foley catheter. The abdomen was prepped and draped in a sterile fashion. A one centimeter infraumbilical incision was made.  The  fascia was incised with a #15 blade.  A Kelly clamp was used to confirm entrance into the peritoneal cavity.  A pursestring suture was passed around the incision with a 0 Vicryl.  The Hasson was introduced into the abdomen and the tails of the suture were used to hold the Hasson in place.   The pneumoperitoneum was then established to steady pressure of 15 mmHg.  Additional 5 mm cannulas then placed in the left lower quadrant of the abdomen and the right upper quadrant under direct visualization. A careful evaluation of the entire  abdomen was carried out. The patient was placed in Trendelenburg and left lateral decubitus position. The small intestines were retracted in the cephalad and left lateral direction away from the pelvis and right lower quadrant. The patient was found to have an enlarged and inflamed appendix that was extending into the pelvis. There was no evidence of perforation.  The appendix was carefully dissected. The appendix was was skeletonized with the harmonic scalpel.   The appendix was divided at its base using an endo-GIA stapler. Minimal appendiceal stump was left in place. There was no evidence of bleeding, leakage, or complication after division of the appendix. Irrigation was also performed and irrigate suctioned from the abdomen as well.  The umbilical port site was closed with the purse string suture. There was no residual palpable fascial defect.  The trocar site skin wounds were closed with 4-0 Monocryl.  Instrument, sponge, and needle counts were correct at the conclusion of the case.   Findings: The appendix was found to be inflamed. There were not signs of necrosis.  There was not perforation. There was not abscess formation.  Estimated Blood Loss:  Minimal         Drains:none         Complications:  None; patient tolerated the procedure well.         Disposition: PACU - hemodynamically stable.         Condition: stable

## 2018-02-16 NOTE — Anesthesia Preprocedure Evaluation (Signed)
Anesthesia Evaluation  Patient identified by MRN, date of birth, ID band Patient awake    Reviewed: Allergy & Precautions, NPO status , Patient's Chart, lab work & pertinent test results  Airway Mallampati: II  TM Distance: >3 FB Neck ROM: Full    Dental no notable dental hx.    Pulmonary neg pulmonary ROS, former smoker,    Pulmonary exam normal breath sounds clear to auscultation       Cardiovascular negative cardio ROS Normal cardiovascular exam Rhythm:Regular Rate:Normal     Neuro/Psych negative neurological ROS  negative psych ROS   GI/Hepatic negative GI ROS, Neg liver ROS,   Endo/Other  negative endocrine ROS  Renal/GU negative Renal ROS  negative genitourinary   Musculoskeletal negative musculoskeletal ROS (+)   Abdominal   Peds negative pediatric ROS (+)  Hematology negative hematology ROS (+)   Anesthesia Other Findings   Reproductive/Obstetrics negative OB ROS                             Anesthesia Physical Anesthesia Plan  ASA: I  Anesthesia Plan: General   Post-op Pain Management:    Induction: Intravenous  PONV Risk Score and Plan: 3 and Ondansetron, Dexamethasone and Treatment may vary due to age or medical condition  Airway Management Planned: Oral ETT  Additional Equipment:   Intra-op Plan:   Post-operative Plan: Extubation in OR  Informed Consent: I have reviewed the patients History and Physical, chart, labs and discussed the procedure including the risks, benefits and alternatives for the proposed anesthesia with the patient or authorized representative who has indicated his/her understanding and acceptance.     Dental advisory given  Plan Discussed with: CRNA and Surgeon  Anesthesia Plan Comments:         Anesthesia Quick Evaluation  

## 2018-02-16 NOTE — H&P (Signed)
Hailey [name] is an 67 y.o. female.   Chief Complaint: RLQ abdominal pain HPI: This is a 66 year old female who presented with a 2-day history of right lower quadrant and right flank abdominal pain.  She underwent a CT scan for kidney stones in the emergency department which showed early acute appendicitis with appendicolith.  She reports the pain is sharp and moderate in intensity.  It is continuous.  She has no nausea or vomiting is otherwise without complaints.  She denies fevers or chills.  Past Medical History:  Diagnosis Date  . Allergy    seasonal  . Constipated    hx of/Miralax helps prn  . Goiter   . Hemorrhoids    history of   . History of colon polyps   . Vertigo    on and off    Past Surgical History:  Procedure Laterality Date  . TONSILLECTOMY     1960  . TUBAL LIGATION     1981    Family History  Problem Relation Age of Onset  . Diabetes Mother   . Hypertension Mother   . Heart disease Mother   . Gout Mother   . Alzheimer's disease Father   . Ulcers Brother    Social History:  reports that she quit smoking about 2 years ago. She smoked 1.00 pack per day. She has never used smokeless tobacco. She reports that she does not drink alcohol or use drugs.  Allergies: No Known Allergies  Medications Prior to Admission  Medication Sig Dispense Refill  . aspirin EC 81 MG tablet Take 81 mg by mouth daily.    . bisacodyl (BISACODYL) 5 MG EC tablet Take 5 mg by mouth daily as needed for moderate constipation.    . Cholecalciferol (VITAMIN D-3) 1000 units CAPS Take by mouth daily.    . meclizine (ANTIVERT) 12.5 MG tablet Take 12.5 mg by mouth as needed for dizziness.    . Multiple Vitamin (MULTIVITAMIN) tablet Take 1 tablet by mouth daily.    Marland Kitchen VIT C-CHOLECALCIFEROL-ROSE HIP PO Take 1 Dose by mouth daily.    . vitamin B-12 (CYANOCOBALAMIN) 1000 MCG tablet Take 1,000 mcg by mouth daily.    Marland Kitchen topiramate (TOPAMAX) 25 MG tablet Take one tablet at night for one week, then  take 2 tablets at night for one week, then take 3 tablets at night. (Patient not taking: Reported on 02/15/2018) 90 tablet 3    Results for orders placed or performed during the hospital encounter of 02/15/18 (from the past 48 hour(s))  Urinalysis, Routine w reflex microscopic- may I&O cath if menses     Status: Abnormal   Collection Time: 02/15/18  9:49 PM  Result Value Ref Range   Color, Urine YELLOW YELLOW   APPearance CLEAR CLEAR   Specific Gravity, Urine 1.010 1.005 - 1.030   pH 6.0 5.0 - 8.0   Glucose, UA NEGATIVE NEGATIVE mg/dL   Hgb urine dipstick SMALL (A) NEGATIVE   Bilirubin Urine NEGATIVE NEGATIVE   Ketones, ur NEGATIVE NEGATIVE mg/dL   Protein, ur NEGATIVE NEGATIVE mg/dL   Nitrite NEGATIVE NEGATIVE   Leukocytes, UA MODERATE (A) NEGATIVE   RBC / HPF 6-10 0 - 5 RBC/hpf   WBC, UA 0-5 0 - 5 WBC/hpf   Bacteria, UA NONE SEEN NONE SEEN   Mucus PRESENT     Comment: Performed at [facility], [place] [address] [place], [place] [address]  CBC     Status: None   Collection Time: 02/15/18  9:49 PM  Result Value Ref Range   WBC 5.4 4.0 - 10.5 K/uL   RBC 4.91 3.87 - 5.11 MIL/uL   Hemoglobin 13.7 12.0 - 15.0 g/dL   HCT 43.1 36.0 - 46.0 %   MCV 87.8 80.0 - 100.0 fL   MCH 27.9 26.0 - 34.0 pg   MCHC 31.8 30.0 - 36.0 g/dL   RDW 11.9 11.5 - 15.5 %   Platelets 285 150 - 400 K/uL   nRBC 0.0 0.0 - 0.2 %    Comment: Performed at [facility], [place] [address] [place], [place] [address]  Basic metabolic panel     Status: Abnormal   Collection Time: 02/15/18  9:49 PM  Result Value Ref Range   Sodium 140 135 - 145 mmol/L   Potassium 3.6 3.5 - 5.1 mmol/L   Chloride 104 98 - 111 mmol/L   CO2 28 22 - 32 mmol/L   Glucose, Bld 102 (H) 70 - 99 mg/dL   BUN 12 8 - 23 mg/dL   Creatinine, Ser 0.74 0.44 - 1.00 mg/dL   Calcium 9.1 8.9 - 10.3 mg/dL   GFR calc non Af Amer >60 >60 mL/min   GFR calc Af Amer >60 >60 mL/min    Comment: (NOTE) The eGFR has been  calculated using the CKD EPI equation. This calculation has not been validated in all clinical situations. eGFR's persistently <60 mL/min signify possible Chronic Kidney Disease.    Anion gap 8 5 - 15    Comment: Performed at [facility], [place] [address], [place], [place] [address]  Lipase, blood     Status: None   Collection Time: 02/15/18 10:07 PM  Result Value Ref Range   Lipase 24 11 - 51 U/L    Comment: Performed at [facility], [place] [address], [place], [place] [address]  Hepatic function panel     Status: None   Collection Time: 02/15/18 10:07 PM  Result Value Ref Range   Total Protein 7.3 6.5 - 8.1 g/dL   Albumin 3.9 3.5 - 5.0 g/dL   AST 16 15 - 41 U/L   ALT 11 0 - 44 U/L   Alkaline Phosphatase 51 38 - 126 U/L   Total Bilirubin 0.4 0.3 - 1.2 mg/dL   Bilirubin, Direct <0.1 0.0 - 0.2 mg/dL   Indirect Bilirubin NOT CALCULATED 0.3 - 0.9 mg/dL    Comment: Performed at [facility], [place] [address] [place],  [address]   Ct Renal Stone Study  Result Date: 02/15/2018 CLINICAL DATA:  Right flank pain radiating to the pelvis since this morning. EXAM: CT ABDOMEN AND PELVIS WITHOUT CONTRAST TECHNIQUE: Multidetector CT imaging of the abdomen and pelvis was performed following the standard protocol without IV contrast. COMPARISON:  None. FINDINGS: Lower chest: Trace pericardial effusion. Top-normal size heart. Dependent atelectasis at each lung base. Hepatobiliary: Small cystic foci are noted of the left hepatic lobe, the largest slightly lobular in appearance and measuring up to 10 mm. No biliary dilatation. Contracted gallbladder without stones. Pancreas: Normal Spleen: Normal Adrenals/Urinary Tract: Normal bilateral adrenal glands. No nephrolithiasis. 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 The urinary bladder is unremarkable for the degree of distention. Stomach/Bowel: Appendix: Location: Right hemipelvis  just lateral and anterior to the right psoas, series 2/55. Diameter: 9 mm Appendicolith: Near the base measuring up to 6 mm. Mucosal hyper-enhancement: Not applicable due to lack of IV contrast. Extraluminal gas: None Periappendiceal collection: No collection  but periappendiceal inflammation is seen. No bowel obstruction. The stomach is unremarkable. Normal small bowel rotation is demonstrated. Average amount of fecal retention within the colon. Vascular/Lymphatic: Mild aortoiliac atherosclerosis.  No adenopathy. Reproductive: Uterus and bilateral adnexa are unremarkable. Other: No free air or free fluid. Musculoskeletal: Degenerative disc disease L5-S1. No acute nor aggressive osseous abnormalities. IMPRESSION: Acute uncomplicated appendicitis. 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 Hepatic hypodensities compatible small cysts, largest approximately 1 cm. Electronically Signed   By: Ashley Royalty M.D.   On: 02/15/2018 22:55    Review of Systems  Constitutional: Negative for chills and fever.  Respiratory: Negative for shortness of breath.   Cardiovascular: Negative for chest pain.  Gastrointestinal: Positive for abdominal pain. Negative for diarrhea, nausea and vomiting.  Genitourinary: Negative for dysuria.  All other systems reviewed and are negative.   Blood pressure (!) 93/59, pulse (!) 54, temperature 97.9 F (36.6 C), temperature source Oral, resp. rate 15, height 5' 4"  (1.626 m), weight 68.4 kg, SpO2 97 %. Physical Exam  Constitutional: She is oriented to person, place, and time. She appears well-developed and well-nourished. No distress.  HENT:  Head: Normocephalic and atraumatic.  Right Ear: External ear normal.  Left Ear: External ear normal.  Nose: Nose normal.  Mouth/Throat: Oropharynx is clear and moist. No oropharyngeal exudate.  Eyes: Pupils are equal, round, and reactive to light. Conjunctivae are normal. No scleral icterus.  Neck: Normal range  of motion. No tracheal deviation present.  Cardiovascular: Normal rate, regular rhythm and normal heart sounds.  Respiratory: Effort normal and breath sounds normal. She exhibits no tenderness.  GI: Soft. There is tenderness. There is guarding.  Mild right lower quadrant tenderness with guarding  Musculoskeletal: Normal range of motion. She exhibits no edema.  Neurological: She is alert and oriented to person, place, and time.  Skin: Skin is warm and dry. She is not diaphoretic. No erythema.  Psychiatric: Her behavior is normal. Judgment normal.     Assessment/Plan Acute appendicitis  I discussed the diagnosis with the patient.  Removal of the appendix is recommended secondary to the inflammation and appendicolith.  I discussed the laparoscopic appendectomy procedure in detail.  I discussed the risk which includes but is not limited to bleeding, infection, injury to surrounding structures, appendiceal stump leak, the need for further procedures, cardiopulmonary issues, postoperative recovery, etc.  She understands and agrees to proceed.  Surgery is scheduled for this morning.  Harl Bowie, MD 02/16/2018, 7:38 AM

## 2018-02-16 NOTE — ED Notes (Signed)
WILL TRANSPORT PT TO 5W 1539-1. AAOX4. PT IN NO APPARENT DISTRESS WITH MILD PAIN. THE OPPORTUNITY TO ASK QUESTIONS WAS PROVIDED.

## 2018-02-16 NOTE — Anesthesia Procedure Notes (Signed)
Procedure Name: Intubation Date/Time: 02/16/2018 8:48 AM Performed by: Lissa Morales, CRNA Pre-anesthesia Checklist: Patient identified, Emergency Drugs available, Suction available and Patient being monitored Patient Re-evaluated:Patient Re-evaluated prior to induction Oxygen Delivery Method: Circle system utilized Preoxygenation: Pre-oxygenation with 100% oxygen Induction Type: IV induction, Rapid sequence and Cricoid Pressure applied Ventilation: Mask ventilation without difficulty Grade View: Grade II Tube type: Oral Tube size: 7.0 mm Number of attempts: 1 Airway Equipment and Method: Stylet and Oral airway Placement Confirmation: ETT inserted through vocal cords under direct vision,  positive ETCO2 and breath sounds checked- equal and bilateral Secured at: 21 cm Tube secured with: Tape Dental Injury: Teeth and Oropharynx as per pre-operative assessment

## 2018-02-16 NOTE — Anesthesia Postprocedure Evaluation (Signed)
Anesthesia Post Note  Patient: Shelbey Spindler  Procedure(s) Performed: APPENDECTOMY LAPAROSCOPIC (N/A Abdomen)     Patient location during evaluation: PACU Anesthesia Type: General Level of consciousness: awake and alert Pain management: pain level controlled Vital Signs Assessment: post-procedure vital signs reviewed and stable Respiratory status: spontaneous breathing, nonlabored ventilation, respiratory function stable and patient connected to nasal cannula oxygen Cardiovascular status: blood pressure returned to baseline and stable Postop Assessment: no apparent nausea or vomiting Anesthetic complications: no    Last Vitals:  Vitals:   02/16/18 1000 02/16/18 1015  BP: 104/62   Pulse: (!) 56 (!) 56  Resp: 13 14  Temp:    SpO2: 96% 96%    Last Pain:  Vitals:   02/16/18 1000  TempSrc:   PainSc: 0-No pain                 Ruthanna Macchia S

## 2018-02-16 NOTE — Transfer of Care (Signed)
Immediate Anesthesia Transfer of Care Note  Patient: Hailey [name]  Procedure(s) Performed: APPENDECTOMY LAPAROSCOPIC (N/A Abdomen)  Patient Location: PACU  Anesthesia Type:General  Level of Consciousness: awake, alert , oriented and patient cooperative  Airway & Oxygen Therapy: Patient Spontanous Breathing and Patient connected to face mask oxygen  Post-op Assessment: Report given to RN, Post -op Vital signs reviewed and stable and Patient moving all extremities X 4  Post vital signs: stable  Last Vitals:  Vitals Value Taken Time  BP 119/67 02/16/2018  9:32 AM  Temp 36.6 C 02/16/2018  9:32 AM  Pulse 52 02/16/2018  9:39 AM  Resp 10 02/16/2018  9:39 AM  SpO2 100 % 02/16/2018  9:39 AM  Vitals shown include unvalidated device data.  Last Pain:  Vitals:   02/16/18 0932  TempSrc:   PainSc: 0-No pain      Patients Stated Pain Goal: 2 (36/46/80 3212)  Complications: No apparent anesthesia complications

## 2018-02-17 ENCOUNTER — Encounter (HOSPITAL_COMMUNITY): Payer: Self-pay | Admitting: Surgery

## 2018-02-17 LAB — URINE CULTURE: Culture: <10000 — AB

## 2018-02-17 MED ORDER — TRAMADOL HCL 50 MG PO TABS: 50.0000 mg | ORAL_TABLET | Freq: Four times a day (QID) | ORAL | 0 refills | Status: DC | PRN

## 2018-02-17 NOTE — Discharge Summary (Signed)
Physician Discharge Summary  Patient ID: Hailey [name] MRN: [id number] DOB/AGE: February 04, 1951 67 y.o.  Admit date: 02/15/2018 Discharge date: 02/17/2018  Admission Diagnoses:  Discharge Diagnoses:  Active Problems:   Appendicitis   Discharged Condition: good  Hospital Course: admitted with appendicitis.  Taken to OR for surgery.   Uneventful post op recovery.  Discharged doing well POD#1  Consults: None  Significant Diagnostic Studies:   Treatments: surgery: laparoscopic appendectomy  Discharge Exam: Blood pressure 101/62, pulse (!) 52, temperature 97.8 F (36.6 C), temperature source Oral, resp. rate 17, height 5\' 4"  (1.626 m), weight 68.4 kg, SpO2 99 %. General appearance: alert, cooperative and no distress Resp: clear to auscultation bilaterally Cardio: regular rate and rhythm, S1, S2 normal, no murmur, click, rub or gallop Incision/Wound: abdomen soft, incisions clean  Disposition: Discharge disposition: 01-Home or Self Care        Allergies as of 02/17/2018   No Known Allergies     Medication List    TAKE these medications   aspirin EC 81 MG tablet Take 81 mg by mouth daily.   bisacodyl 5 MG EC tablet Generic drug:  bisacodyl Take 5 mg by mouth daily as needed for moderate constipation.   meclizine 12.5 MG tablet Commonly known as:  ANTIVERT Take 12.5 mg by mouth as needed for dizziness.   multivitamin tablet Take 1 tablet by mouth daily.   topiramate 25 MG tablet Commonly known as:  TOPAMAX Take one tablet at night for one week, then take 2 tablets at night for one week, then take 3 tablets at night.   traMADol 50 MG tablet Commonly known as:  ULTRAM Take 1 tablet (50 mg total) by mouth every 6 (six) hours as needed for moderate pain or severe pain.   VIT C-CHOLECALCIFEROL-ROSE HIP PO Take 1 Dose by mouth daily.   vitamin B-12 1000 MCG tablet Commonly known as:  CYANOCOBALAMIN Take 1,000 mcg by mouth daily.   Vitamin D-3 1000 units  Caps Take by mouth daily.      Follow-up Information    Coralie Keens, MD. Schedule an appointment as soon as possible for a visit in 3 week(s).   Specialty:  General Surgery Contact information: [address] [place] [address] [phone]           Signed: Harl Bowie 02/17/2018, 9:09 AM

## 2018-02-17 NOTE — Progress Notes (Signed)
Patient ID: Hailey [name], female   DOB: Oct 25, 1950, 67 y.o.   MRN: [id number]   Doing well Tolerating po Pain controlled Abdomen soft.  Plan: Discharge home

## 2018-02-17 NOTE — Progress Notes (Signed)
Assessment unchanged. Pt verbalized understanding of dc instructions through teach back including follow up care and when to call the doctor. Script x 1 given per MD. Discharged via foot per request to front entrance to meet sister for transport home.

## 2018-02-17 NOTE — Discharge Instructions (Signed)
[facility] ______[facility] LAPAROSCOPIC SURGERY: POST OP INSTRUCTIONS Always review your discharge instruction sheet given to you by the facility where your surgery was performed. IF YOU HAVE DISABILITY OR FAMILY LEAVE FORMS, YOU MUST BRING THEM TO THE OFFICE FOR PROCESSING.   DO NOT GIVE THEM TO YOUR DOCTOR.  1. A prescription for pain medication may be given to you upon discharge.  Take your pain medication as prescribed, if needed.  If narcotic pain medicine is not needed, then you may take acetaminophen (Tylenol) or ibuprofen (Advil) as needed. 2. Take your usually prescribed medications unless otherwise directed. 3. If you need a refill on your pain medication, please contact your pharmacy.  They will contact our office to request authorization. Prescriptions will not be filled after 5pm or on week-ends. 4. You should follow a light diet the first few days after arrival home, such as soup and crackers, etc.  Be sure to include lots of fluids daily. 5. Most patients will experience some swelling and bruising in the area of the incisions.  Ice packs will help.  Swelling and bruising can take several days to resolve.  6. It is common to experience some constipation if taking pain medication after surgery.  Increasing fluid intake and taking a stool softener (such as Colace) will usually help or prevent this problem from occurring.  A mild laxative (Milk of Magnesia or Miralax) should be taken according to package instructions if there are no bowel movements after 48 hours. 7. Unless discharge instructions indicate otherwise, you may remove your bandages 24-48 hours after surgery, and you may shower at that time.  You may have steri-strips (small skin tapes) in place directly over the incision.  These strips should be left on the skin for 7-10 days.  If your surgeon used skin glue on the incision, you may shower in 24 hours.  The glue will flake off over the next 2-3 weeks.  Any sutures or  staples will be removed at the office during your follow-up visit. 8. ACTIVITIES:  You may resume regular (light) daily activities beginning the next day--such as daily self-care, walking, climbing stairs--gradually increasing activities as tolerated.  You may have sexual intercourse when it is comfortable.  Refrain from any heavy lifting or straining until approved by your doctor. a. You may drive when you are no longer taking prescription pain medication, you can comfortably wear a seatbelt, and you can safely maneuver your car and apply brakes. b. RETURN TO WORK:  __________________________________________________________ 9. You should see your doctor in the office for a follow-up appointment approximately 2-3 weeks after your surgery.  Make sure that you call for this appointment within a day or two after you arrive home to insure a convenient appointment time. 10. OTHER INSTRUCTIONS:OK TO SHOWER STARTING TODAY 11. NO LIFTING MORE THAN 15 TO 20 POUNDS FOR 2 WEEKS 12. TYLENOL, IBUPROFEN, ALEVE FOR PAIN ALSO __________________________________________________________________________________________________________________________ __________________________________________________________________________________________________________________________ WHEN TO CALL YOUR DOCTOR: 1. Fever over 101.0 2. Inability to urinate 3. Continued bleeding from incision. 4. Increased pain, redness, or drainage from the incision. 5. Increasing abdominal pain  The clinic staff is available to answer your questions during regular business hours.  Please dont hesitate to call and ask to speak to one of the nurses for clinical concerns.  If you have a medical emergency, go to the nearest emergency room or call 911.  A surgeon from [facility] is always on call at the hospital. [address] [place],  [place], [place]  [address] ? [address] [address], [place], [place]   [address] [phone] ?  [phone] ? FAX [phone] Web site: [email or web address] 

## 2018-02-19 ENCOUNTER — Encounter: Payer: Self-pay | Admitting: Gastroenterology

## 2018-03-19 ENCOUNTER — Other Ambulatory Visit: Payer: Self-pay

## 2018-03-19 ENCOUNTER — Ambulatory Visit (AMBULATORY_SURGERY_CENTER): Payer: Self-pay

## 2018-03-19 VITALS — Ht 64.0 in | Wt 149.8 lb

## 2018-03-19 DIAGNOSIS — Z86010 Personal history of colonic polyps: Secondary | ICD-10-CM

## 2018-03-19 NOTE — Progress Notes (Signed)
No egg or soy allergy known to patient  No issues with past sedation with any surgeries  or procedures, no intubation problems  No diet pills per patient No home 02 use per patient  No blood thinners per patient  Pt has problems with  constipation  Giving 2 day prep No A fib or A flutter  EMMI video sent to pt's e mail

## 2018-03-25 ENCOUNTER — Telehealth: Payer: Self-pay | Admitting: Gastroenterology

## 2018-03-25 MED ORDER — NA SULFATE-K SULFATE-MG SULF 17.5-3.13-1.6 GM/177ML PO SOLN: 1.0000 | Freq: Once | ORAL | 0 refills | Status: AC

## 2018-03-25 NOTE — Telephone Encounter (Signed)
Sent in [place] - to [facility]- called pt and made her aware  Lelan Pons PV

## 2018-04-03 ENCOUNTER — Encounter: Payer: Medicare (Managed Care) | Admitting: Gastroenterology

## 2018-04-26 ENCOUNTER — Encounter: Payer: Self-pay | Admitting: Gastroenterology

## 2018-04-26 ENCOUNTER — Ambulatory Visit (AMBULATORY_SURGERY_CENTER): Payer: Medicare Other | Admitting: Gastroenterology

## 2018-04-26 VITALS — BP 113/70 | HR 59 | Temp 97.5°F | Resp 10 | Ht 64.0 in | Wt 157.0 lb

## 2018-04-26 DIAGNOSIS — K635 Polyp of colon: Secondary | ICD-10-CM

## 2018-04-26 DIAGNOSIS — D123 Benign neoplasm of transverse colon: Secondary | ICD-10-CM

## 2018-04-26 DIAGNOSIS — Z86010 Personal history of colonic polyps: Secondary | ICD-10-CM | POA: Diagnosis not present

## 2018-04-26 DIAGNOSIS — D129 Benign neoplasm of anus and anal canal: Secondary | ICD-10-CM

## 2018-04-26 DIAGNOSIS — D128 Benign neoplasm of rectum: Secondary | ICD-10-CM

## 2018-04-26 DIAGNOSIS — K621 Rectal polyp: Secondary | ICD-10-CM

## 2018-04-26 DIAGNOSIS — D125 Benign neoplasm of sigmoid colon: Secondary | ICD-10-CM

## 2018-04-26 MED ORDER — SODIUM CHLORIDE 0.9 % IV SOLN: 500.0000 mL | Freq: Once | INTRAVENOUS | Status: DC

## 2018-04-26 NOTE — Op Note (Addendum)
[name] Procedure Date: 04/26/2018 10:47 AM MRN: [id number] Endoscopist: Ladene Artist , MD Age: 68 Referring MD:  Date of Birth: [birth year] Gender: Female Account #: [id number] Procedure:                Colonoscopy Indications:              Surveillance: Personal history of colonic polyps                            (unknown histology) on last colonoscopy more than 5                            years ago Medicines:                Monitored Anesthesia Care Procedure:                Pre-Anesthesia Assessment:                           - Prior to the procedure, a History and Physical                            was performed, and patient medications and                            allergies were reviewed. The patient's tolerance of                            previous anesthesia was also reviewed. The risks                            and benefits of the procedure and the sedation                            options and risks were discussed with the patient.                            All questions were answered, and informed consent                            was obtained. Prior Anticoagulants: The patient has                            taken no previous anticoagulant or antiplatelet                            agents. ASA Grade Assessment: II - A patient with                            mild systemic disease. After reviewing the risks                            and benefits, the patient was deemed in  satisfactory condition to undergo the procedure.                           After obtaining informed consent, the colonoscope                            was passed under direct vision. Throughout the                            procedure, the patient's blood pressure, pulse, and                            oxygen saturations were monitored continuously. The                            Model CF-HQ190L [id number]) scope was  introduced                            through the anus and advanced to the the cecum,                            identified by appendiceal orifice and ileocecal                            valve. The ileocecal valve, appendiceal orifice,                            and rectum were photographed. The quality of the                            bowel preparation was good. The colonoscopy was                            performed without difficulty. The patient tolerated                            the procedure well. Scope In: 10:54:47 AM Scope Out: 11:16:36 AM Scope Withdrawal Time: 0 hours 17 minutes 44 seconds  Total Procedure Duration: 0 hours 21 minutes 49 seconds  Findings:                 The perianal and digital rectal examinations were                            normal.                           Six sessile polyps were found in the rectum (1),                            sigmoid colon (3) and transverse colon (2). The                            polyps were 5 to 7 mm in size. These polyps were  removed with a cold snare. Resection and retrieval                            were complete.                           Internal hemorrhoids were found during                            retroflexion. The hemorrhoids were small and Grade                            I (internal hemorrhoids that do not prolapse).                           The exam was otherwise without abnormality on                            direct and retroflexion views. Complications:            No immediate complications. Estimated blood loss:                            None. Estimated Blood Loss:     Estimated blood loss: none. Impression:               - Six 5 to 7 mm polyps in the rectum, in the                            sigmoid colon and in the transverse colon, removed                            with a cold snare. Resected and retrieved.                           - Internal hemorrhoids.                            - The examination was otherwise normal on direct                            and retroflexion views. Recommendation:           - Repeat colonoscopy in 3 - 5 years for                            surveillance based on pathology results.                           - Patient has a contact number available for                            emergencies. The signs and symptoms of potential                            delayed complications were discussed with the  patient. Return to normal activities tomorrow.                            Written discharge instructions were provided to the                            patient.                           - Resume previous diet.                           - Continue present medications.                           - Await pathology results. Ladene Artist, MD 04/26/2018 11:23:07 AM This report has been signed electronically.

## 2018-04-26 NOTE — Patient Instructions (Signed)
Information on polyps and hemorrhoids given to you todat.  Await pathology results.   YOU HAD AN ENDOSCOPIC PROCEDURE TODAY AT [facility]:   Refer to the procedure report that was given to you for any specific questions about what was found during the examination.  If the procedure report does not answer your questions, please call your gastroenterologist to clarify.  If you requested that your care partner not be given the details of your procedure findings, then the procedure report has been included in a sealed envelope for you to review at your convenience later.  YOU SHOULD EXPECT: Some feelings of bloating in the abdomen. Passage of more gas than usual.  Walking can help get rid of the air that was put into your GI tract during the procedure and reduce the bloating. If you had a lower endoscopy (such as a colonoscopy or flexible sigmoidoscopy) you may notice spotting of blood in your stool or on the toilet paper. If you underwent a bowel prep for your procedure, you may not have a normal bowel movement for a few days.  Please Note:  You might notice some irritation and congestion in your nose or some drainage.  This is from the oxygen used during your procedure.  There is no need for concern and it should clear up in a day or so.  SYMPTOMS TO REPORT IMMEDIATELY:   Following lower endoscopy (colonoscopy or flexible sigmoidoscopy):  Excessive amounts of blood in the stool  Significant tenderness or worsening of abdominal pains  Swelling of the abdomen that is new, acute  Fever of 100F or higher  For urgent or emergent issues, a gastroenterologist can be reached at any hour by calling [phone].   DIET:  We do recommend a small meal at first, but then you may proceed to your regular diet.  Drink plenty of fluids but you should avoid alcoholic beverages for 24 hours.  ACTIVITY:  You should plan to take it easy for the rest of today and you should NOT DRIVE or use  heavy machinery until tomorrow (because of the sedation medicines used during the test).    FOLLOW UP: Our staff will call the number listed on your records the next business day following your procedure to check on you and address any questions or concerns that you may have regarding the information given to you following your procedure. If we do not reach you, we will leave a message.  However, if you are feeling well and you are not experiencing any problems, there is no need to return our call.  We will assume that you have returned to your regular daily activities without incident.  If any biopsies were taken you will be contacted by phone or by letter within the next 1-3 weeks.  Please call us at [phone] if you have not heard about the biopsies in 3 weeks.    SIGNATURES/CONFIDENTIALITY: You and/or your care partner have signed paperwork which will be entered into your electronic medical record.  These signatures attest to the fact that that the information above on your After Visit Summary has been reviewed and is understood.  Full responsibility of the confidentiality of this discharge information lies with you and/or your care-partner.

## 2018-04-26 NOTE — Progress Notes (Signed)
Called to room to assist during endoscopic procedure.  Patient ID and intended procedure confirmed with present staff. Received instructions for my participation in the procedure from the performing physician.  

## 2018-04-26 NOTE — Progress Notes (Signed)
Report to PACU, RN, vss, BBS= Clear.  

## 2018-04-29 ENCOUNTER — Telehealth: Payer: Self-pay

## 2018-04-29 NOTE — Telephone Encounter (Signed)
  Follow up Call-  Call back number 04/26/2018  Post procedure Call Back phone  # [phone]  Permission to leave phone message Yes  Some recent data might be hidden     Patient questions:  Do you have a fever, pain , or abdominal swelling? No. Pain Score  0 *  Have you tolerated food without any problems? Yes.    Have you been able to return to your normal activities? Yes.    Do you have any questions about your discharge instructions: Diet   No. Medications  No. Follow up visit  No.  Do you have questions or concerns about your Care? No.  Actions: * If pain score is 4 or above: No action needed, pain <4.  No problems noted per pt. maw

## 2018-05-07 ENCOUNTER — Encounter: Payer: Self-pay | Admitting: Gastroenterology

## 2019-03-24 ENCOUNTER — Other Ambulatory Visit: Payer: Self-pay

## 2019-03-24 DIAGNOSIS — Z20822 Contact with and (suspected) exposure to covid-19: Secondary | ICD-10-CM

## 2019-03-25 LAB — NOVEL CORONAVIRUS, NAA: SARS-CoV-2, NAA: NOT DETECTED

## 2019-06-27 ENCOUNTER — Other Ambulatory Visit: Payer: Self-pay | Admitting: Internal Medicine

## 2019-06-27 DIAGNOSIS — E2839 Other primary ovarian failure: Secondary | ICD-10-CM

## 2020-10-27 ENCOUNTER — Other Ambulatory Visit: Payer: Self-pay | Admitting: Internal Medicine

## 2020-10-28 LAB — COMPLETE METABOLIC PANEL WITH GFR
AG Ratio: 1.4 (calc) (ref 1.0–2.5)
ALT: 16 U/L (ref 6–29)
AST: 17 U/L (ref 10–35)
Albumin: 4.2 g/dL (ref 3.6–5.1)
Alkaline phosphatase (APISO): 54 U/L (ref 37–153)
BUN: 14 mg/dL (ref 7–25)
CO2: 26 mmol/L (ref 20–32)
Calcium: 9.8 mg/dL (ref 8.6–10.4)
Chloride: 103 mmol/L (ref 98–110)
Creat: 0.76 mg/dL (ref 0.60–1.00)
Globulin: 3 g/dL (calc) (ref 1.9–3.7)
Glucose, Bld: 85 mg/dL (ref 65–99)
Potassium: 4.1 mmol/L (ref 3.5–5.3)
Sodium: 139 mmol/L (ref 135–146)
Total Bilirubin: 0.5 mg/dL (ref 0.2–1.2)
Total Protein: 7.2 g/dL (ref 6.1–8.1)
eGFR: 84 mL/min/{1.73_m2} (ref >=60)

## 2020-10-28 LAB — LIPID PANEL
Cholesterol: 246 mg/dL — ABNORMAL HIGH (ref <200)
HDL: 52 mg/dL (ref >=50)
LDL Cholesterol (Calc): 155 mg/dL (calc) — ABNORMAL HIGH
Non-HDL Cholesterol (Calc): 194 mg/dL (calc) — ABNORMAL HIGH (ref <130)
Total CHOL/HDL Ratio: 4.7 (calc) (ref <5.0)
Triglycerides: 216 mg/dL — ABNORMAL HIGH (ref <150)

## 2020-10-28 LAB — CBC
HCT: 43.4 % (ref 35.0–45.0)
Hemoglobin: 14.3 g/dL (ref 11.7–15.5)
MCH: 28.2 pg (ref 27.0–33.0)
MCHC: 32.9 g/dL (ref 32.0–36.0)
MCV: 85.6 fL (ref 80.0–100.0)
MPV: 10.8 fL (ref 7.5–12.5)
Platelets: 284 10*3/uL (ref 140–400)
RBC: 5.07 10*6/uL (ref 3.80–5.10)
RDW: 12.4 % (ref 11.0–15.0)
WBC: 5.6 10*3/uL (ref 3.8–10.8)

## 2020-10-28 LAB — URINE CULTURE
MICRO NUMBER:: 12114460
SPECIMEN QUALITY:: ADEQUATE

## 2020-10-28 LAB — TSH: TSH: 1.04 mIU/L (ref 0.40–4.50)

## 2021-07-12 ENCOUNTER — Ambulatory Visit: Payer: Medicare PPO

## 2021-07-12 ENCOUNTER — Encounter: Payer: Self-pay | Admitting: Emergency Medicine

## 2021-07-12 ENCOUNTER — Other Ambulatory Visit: Payer: Self-pay

## 2021-07-12 ENCOUNTER — Ambulatory Visit (INDEPENDENT_AMBULATORY_CARE_PROVIDER_SITE_OTHER): Payer: Medicare PPO

## 2021-07-12 ENCOUNTER — Ambulatory Visit
Admission: EM | Admit: 2021-07-12 | Discharge: 2021-07-12 | Disposition: A | Discharge status: Home / Self Care | Payer: Medicare PPO | Attending: Physician Assistant | Admitting: Physician Assistant

## 2021-07-12 DIAGNOSIS — M5010 Cervical disc disorder with radiculopathy, unspecified cervical region: Secondary | ICD-10-CM

## 2021-07-12 DIAGNOSIS — M79621 Pain in right upper arm: Secondary | ICD-10-CM | POA: Diagnosis not present

## 2021-07-12 DIAGNOSIS — M79641 Pain in right hand: Secondary | ICD-10-CM | POA: Diagnosis not present

## 2021-07-12 DIAGNOSIS — M542 Cervicalgia: Secondary | ICD-10-CM

## 2021-07-12 DIAGNOSIS — M79631 Pain in right forearm: Secondary | ICD-10-CM | POA: Diagnosis not present

## 2021-07-12 MED ORDER — TIZANIDINE HCL 4 MG PO TABS: 4.0000 mg | ORAL_TABLET | Freq: Four times a day (QID) | ORAL | 0 refills | Status: AC | PRN

## 2021-07-12 MED ORDER — PREDNISONE 20 MG PO TABS: 40.0000 mg | ORAL_TABLET | Freq: Every day | ORAL | 0 refills | Status: AC

## 2021-07-12 NOTE — ED Provider Notes (Signed)
?[facility] ? ? ? ?CSN: [id number] ?Arrival date & time: 07/12/21  1324 ? ? ?  ? ?History   ?Chief Complaint ?Chief Complaint  ?Patient presents with  ? Arm Pain  ? ? ?HPI ?Hailey [name] is a 71 y.o. female.  ? ?Patient here today for evaluation of right shoulder, upper right arm pain with radiation into right hand. She notes neck pain as well. Movement makes symptoms worse. She denies numbness. She notes some swelling to a few of her right fingers. She does note several falls over the last month due [place], but nothing today. She does not report any treatment for symptoms. ? ?The history is provided by the patient.  ?Arm Pain ?Pertinent negatives include no abdominal pain.  ? ?Past Medical History:  ?Diagnosis Date  ? Allergy   ? seasonal  ? Constipated   ? hx of/Miralax helps prn  ? Goiter   ? Hemorrhoids   ? history of   ? History of colon polyps   ? Hyperlipidemia   ? Vertigo   ? on and off  ? ? ?Patient Active Problem List  ? Diagnosis Date Noted  ? Appendicitis 02/15/2018  ? Vertigo 09/12/2016  ? ? ?Past Surgical History:  ?Procedure Laterality Date  ? LAPAROSCOPIC APPENDECTOMY N/A 02/16/2018  ? Procedure: APPENDECTOMY LAPAROSCOPIC;  Surgeon: Coralie Keens, MD;  Location: [facility];  Service: General;  Laterality: N/A;  ? TONSILLECTOMY    ? 1960  ? TUBAL LIGATION    ? 1981  ? ? ?OB History   ?No obstetric history on file. ?  ? ? ? ?Home Medications   ? ?Prior to Admission medications   ?Medication Sig Start Date End Date Taking? Authorizing Provider  ?predniSONE (DELTASONE) 20 MG tablet Take 2 tablets (40 mg total) by mouth daily with breakfast for 5 days. 07/12/21 07/17/21 Yes Francene Finders, PA-C  ?tiZANidine (ZANAFLEX) 4 MG tablet Take 1 tablet (4 mg total) by mouth every 6 (six) hours as needed for muscle spasms. 07/12/21  Yes Francene Finders, PA-C  ?aspirin EC 81 MG tablet Take 81 mg by mouth daily. 02/27/17   [provider]  ?bisacodyl (BISACODYL) 5 MG EC tablet  Take 5 mg by mouth daily as needed for moderate constipation.    [provider]  ?Cholecalciferol (VITAMIN D-3) 1000 units CAPS Take by mouth daily.    [provider]  ?meclizine (ANTIVERT) 12.5 MG tablet Take 12.5 mg by mouth as needed for dizziness.    [provider]  ?Multiple Vitamin (MULTIVITAMIN) tablet Take 1 tablet by mouth daily.    [provider]  ?naproxen sodium (ALEVE) 220 MG tablet Take 220 mg by mouth 2 (two) times daily as needed.    [provider]  ?ondansetron (ZOFRAN-ODT) 4 MG disintegrating tablet Take 4 mg by mouth every 6 (six) hours as needed. 02/18/18   [provider]  ?topiramate (TOPAMAX) 25 MG tablet Take one tablet at night for one week, then take 2 tablets at night for one week, then take 3 tablets at night. ?Patient not taking: Reported on 02/15/2018 09/12/16   Kathrynn Ducking, MD  ?VIT C-CHOLECALCIFEROL-ROSE HIP PO Take 1 Dose by mouth daily.    [provider]  ?vitamin B-12 (CYANOCOBALAMIN) 1000 MCG tablet Take 1,000 mcg by mouth daily.    [provider]  ? ? ?Family History ?Family History  ?Problem Relation Age of Onset  ? Diabetes Mother   ?  Hypertension Mother   ? Heart disease Mother   ? Gout Mother   ? Alzheimer's disease Father   ? Ulcers Brother   ? Colon cancer Neg Hx   ? Esophageal cancer Neg Hx   ? Rectal cancer Neg Hx   ? Stomach cancer Neg Hx   ? ? ?Social History ?Social History  ? ?Tobacco Use  ? Smoking status: Former  ?  Packs/day: 1.00  ?  Types: Cigarettes  ?  Quit date: 11/27/2015  ?  Years since quitting: 5.6  ? Smokeless tobacco: Never  ? Tobacco comments:  ?  june 23 , 2019 quit date  ?Vaping Use  ? Vaping Use: Former  ?Substance Use Topics  ? Alcohol use: No  ? Drug use: No  ? ? ? ?Allergies   ?Patient has no known allergies. ? ? ?Review of Systems ?Review of Systems  ?Constitutional:  Negative for chills and fever.  ?Eyes:  Negative for discharge and redness.  ?Gastrointestinal:   Negative for abdominal pain, nausea and vomiting.  ?Musculoskeletal:  Positive for arthralgias and myalgias.  ?Neurological:  Negative for weakness and numbness.  ? ? ?Physical Exam ?Triage Vital Signs ?ED Triage Vitals [07/12/21 1422]  ?Enc Vitals Group  ?   BP 98/66  ?   Pulse Rate 82  ?   Resp 18  ?   Temp 98.1 ?F (36.7 ?C)  ?   Temp Source Oral  ?   SpO2 95 %  ?   Weight   ?   Height   ?   Head Circumference   ?   Peak Flow   ?   Pain Score 8  ?   Pain Loc   ?   Pain Edu?   ?   Excl. in [place]?   ? ?No data found. ? ?Updated Vital Signs ?BP 98/66 (BP Location: Left Arm)   Pulse 82   Temp 98.1 ?F (36.7 ?C) (Oral)   Resp 18   SpO2 95%  ? ?Physical Exam ?Vitals and nursing note reviewed.  ?Constitutional:   ?   General: She is not in acute distress. ?   Appearance: Normal appearance. She is not ill-appearing.  ?HENT:  ?   Head: Normocephalic and atraumatic.  ?Eyes:  ?   Conjunctiva/sclera: Conjunctivae normal.  ?Cardiovascular:  ?   Rate and Rhythm: Normal rate.  ?Pulmonary:  ?   Effort: Pulmonary effort is normal.  ?Musculoskeletal:  ?   Comments: Decreased ROM of cervical spine, right shoulder, right elbow, right wrist due to pain, mild swelling present to right 3 middle fingers  ?Skin: ?   Capillary Refill: Normal cap refill to right fingers ?Neurological:  ?   Mental Status: She is alert.  ?   Comments: Gross sensation intact to distal right fingers  ?Psychiatric:     ?   Mood and Affect: Mood normal.     ?   Behavior: Behavior normal.     ?   Thought Content: Thought content normal.  ? ? ? ?UC Treatments / Results  ?Labs ?(all labs ordered are listed, but only abnormal results are displayed) ?Labs Reviewed - No data to display ? ?EKG ? ? ?Radiology ?DG Cervical Spine Complete ? ?Result Date: 07/12/2021 ?CLINICAL DATA:  Neck pain EXAM: CERVICAL SPINE - COMPLETE 4+ VIEW COMPARISON:  None. FINDINGS: No recent fracture is seen. There is straightening of lordosis. Degenerative changes are noted with bony spurs at  C5-C6 and C6-C7 levels. There is  encroachment of neural foramina at C5-C6 and C6-C7 levels. Prevertebral soft tissues are unremarkable. IMPRESSION: No recent fracture is seen in the cervical spine. Cervical spondylosis with encroachment of neural foramina at C5-C6 and C6-C7 levels. Electronically Signed   By: Elmer Picker M.D.   On: 07/12/2021 15:43  ? ?DG Forearm Right ? ?Result Date: 07/12/2021 ?CLINICAL DATA:  Pain and swelling EXAM: RIGHT FOREARM - 2 VIEW COMPARISON:  None. FINDINGS: No fracture or dislocation is seen. There are no focal lytic lesions. IMPRESSION: No radiographic abnormality is seen in the right forearm. Electronically Signed   By: Elmer Picker M.D.   On: 07/12/2021 15:48  ? ?DG Humerus Right ? ?Result Date: 07/12/2021 ?CLINICAL DATA:  Pain and swelling EXAM: RIGHT HUMERUS - 2+ VIEW COMPARISON:  None. FINDINGS: No fracture or dislocation is seen. There are no focal lytic lesions. IMPRESSION: No radiographic abnormality is seen in the right humerus. Electronically Signed   By: Elmer Picker M.D.   On: 07/12/2021 15:47  ? ?DG Hand Complete Right ? ?Result Date: 07/12/2021 ?CLINICAL DATA:  Pain and swelling EXAM: RIGHT HAND - COMPLETE 3+ VIEW COMPARISON:  None. FINDINGS: No fracture or dislocation is seen. There are no focal lytic lesions. Soft tissue swelling is noted over the PIP joints. IMPRESSION: No significant radiographic abnormality is seen in the right hand. Electronically Signed   By: Elmer Picker M.D.   On: 07/12/2021 15:49   ? ?Procedures ?Procedures (including critical care time) ? ?Medications Ordered in UC ?Medications - No data to display ? ?Initial Impression / Assessment and Plan / UC Course  ?I have reviewed the triage vital signs and the nursing notes. ? ?Pertinent labs & imaging results that were available during my care of the patient were reviewed by me and considered in my medical decision making (see chart for details). ? ?  ?Discussed xrays of  c-spine vs right arm and patient would like to have radiographs of all due to concerns with multiple areas of pain with known falls. Reassured that xrays were all normal with exception of c-Spine. I suspect radiculopa

## 2021-07-12 NOTE — ED Triage Notes (Signed)
Pt here with right shoulder and upper arm pain with radiation to right hand; pt sts swelling of digits on right hand with pain; pt sts 3 falls over last month but nothing today ?

## 2023-10-04 IMAGING — DX DG CERVICAL SPINE COMPLETE 4+V
4 series · 4 of 4 positions shown · non-contrast
Comparison: None.

CLINICAL DATA: Neck pain

EXAM:
CERVICAL SPINE - COMPLETE 4+ VIEW

[cervical spine ap]
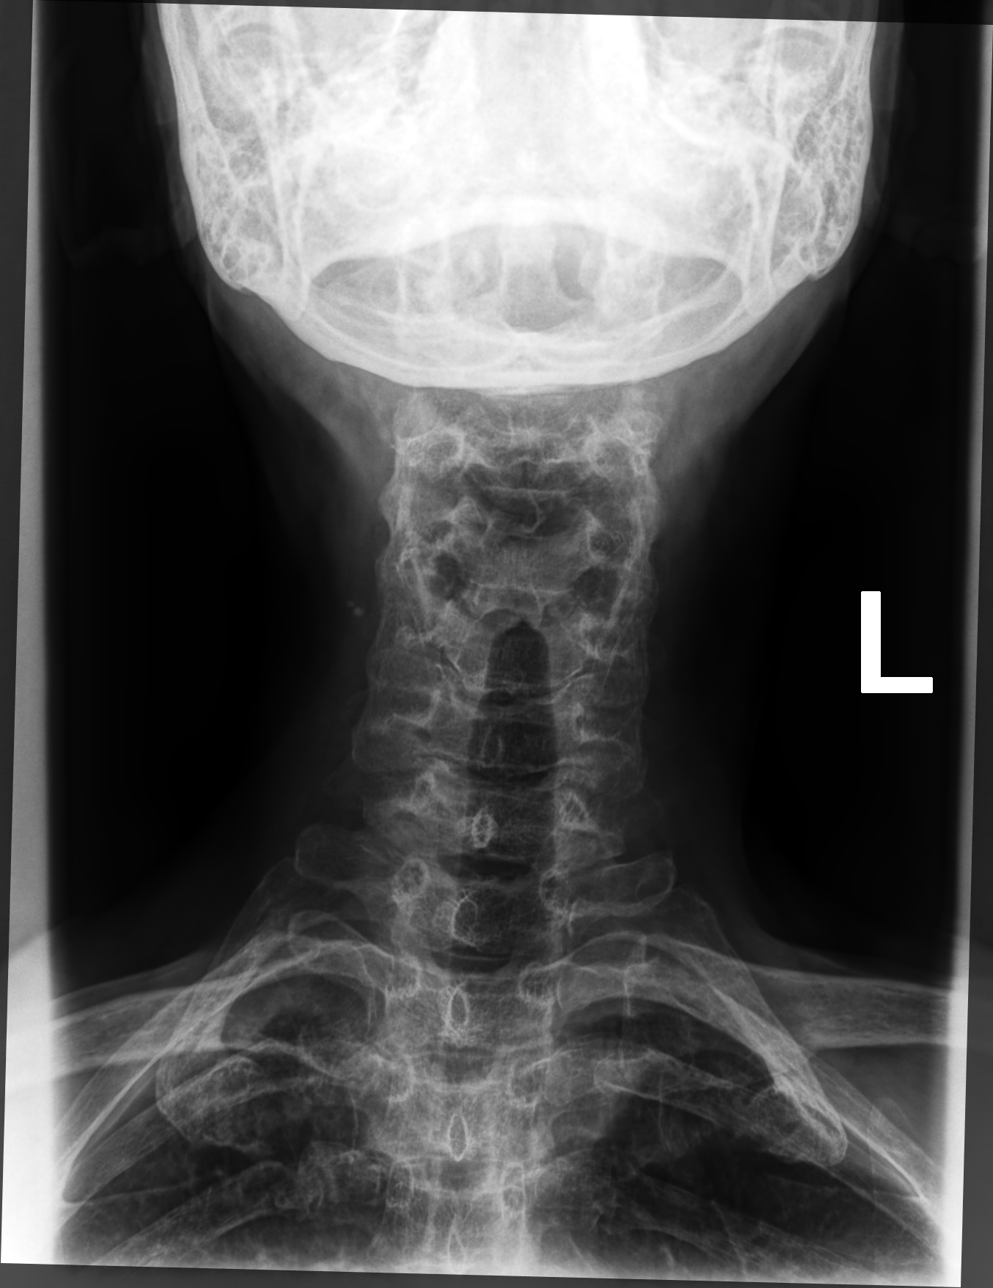

[cervical spine oblique (1 of 2)]
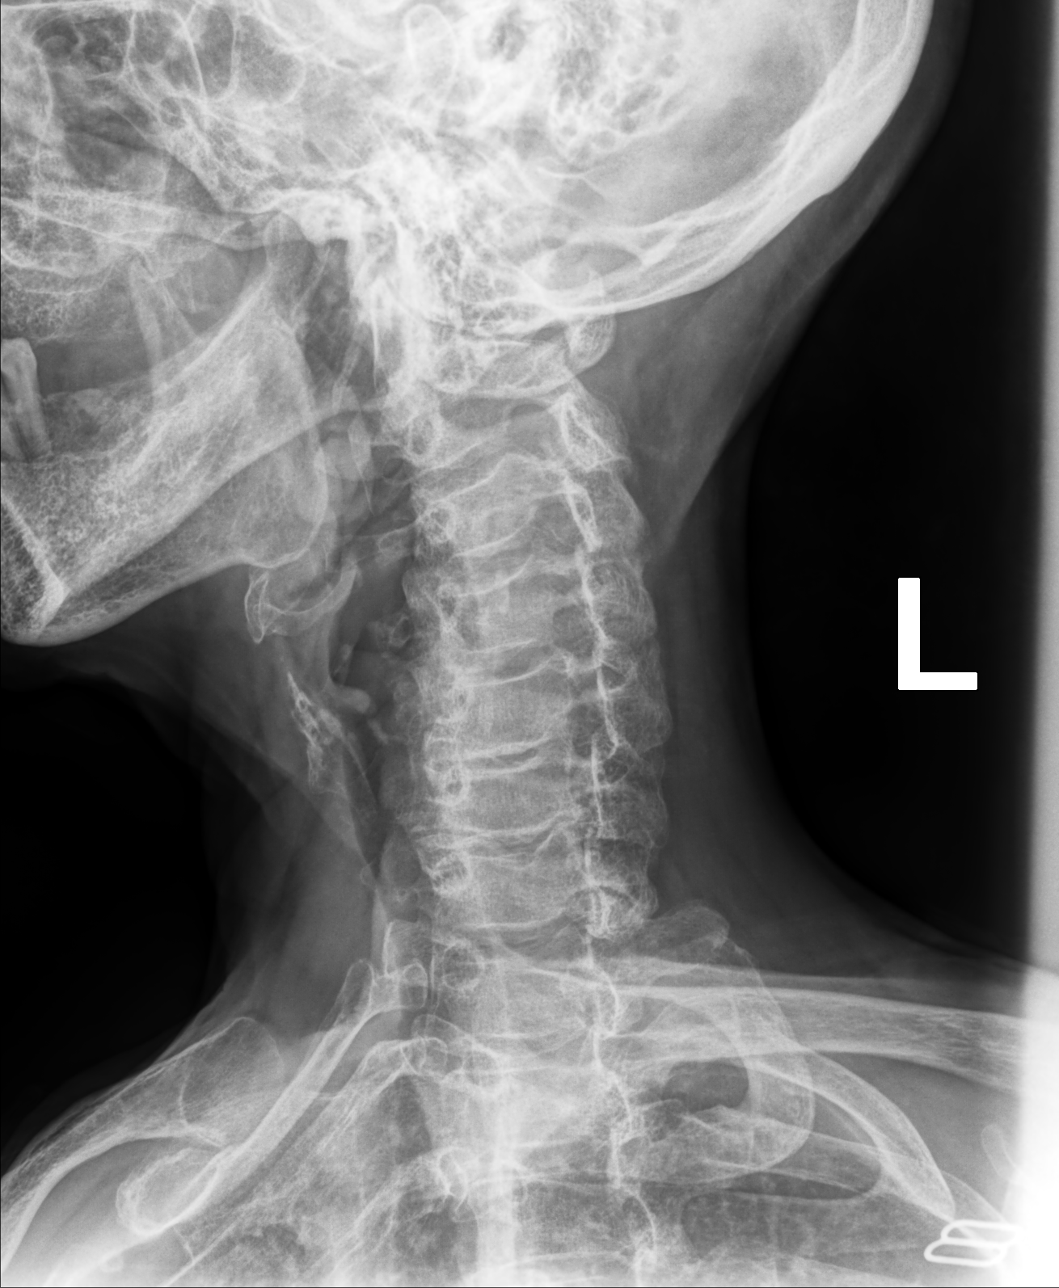

[cervical spine oblique (2 of 2)]
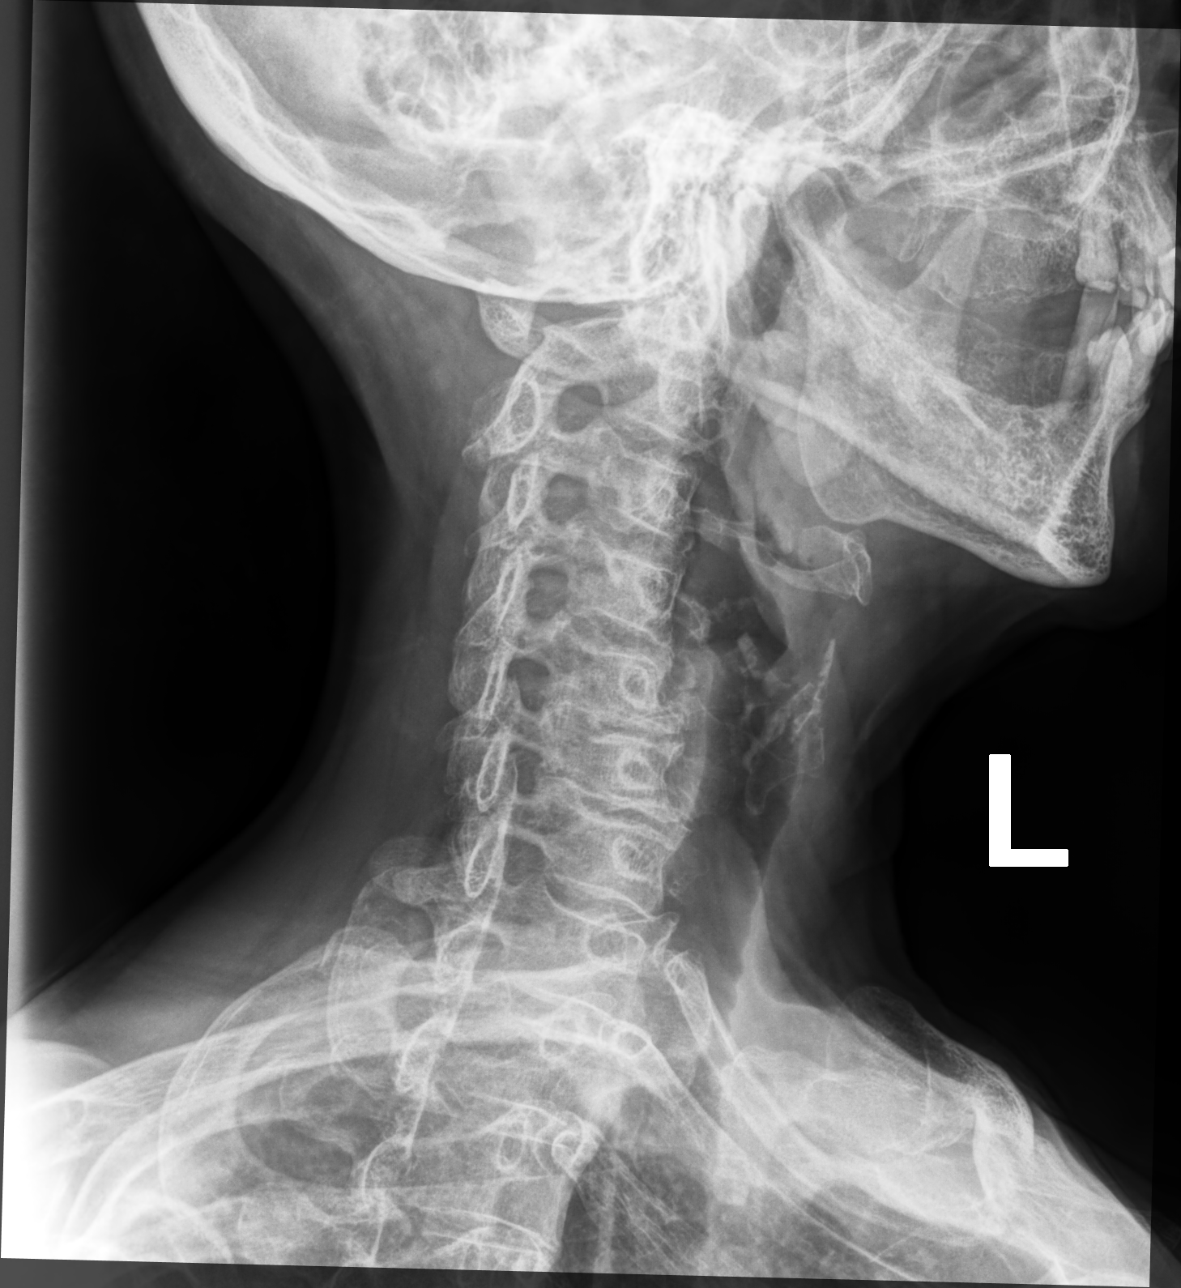

[cervical spine lat]
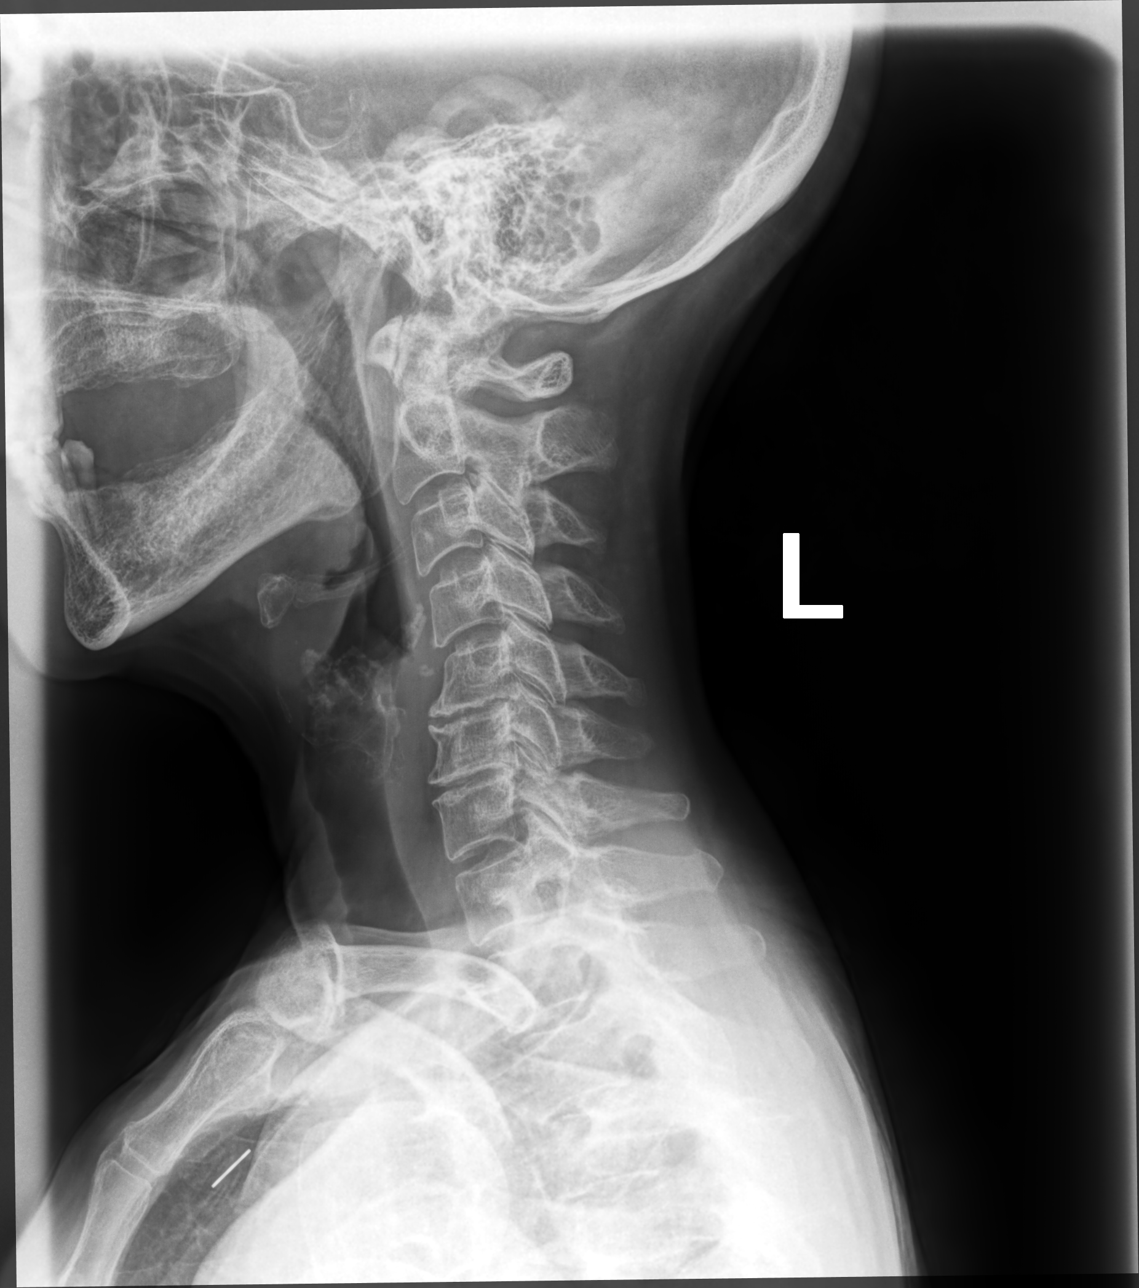

[4 of 4 positions shown; findings below may reference images not displayed]

FINDINGS: No recent fracture is seen. There is straightening of lordosis.
Degenerative changes are noted with bony spurs at C5-C6 and C6-C7
levels. There is encroachment of neural foramina at C5-C6 and C6-C7
levels. Prevertebral soft tissues are unremarkable.
IMPRESSION: No recent fracture is seen in the cervical spine. Cervical
spondylosis with encroachment of neural foramina at C5-C6 and C6-C7
levels.
